# Patient Record
Sex: Female | Born: 1965 | Race: White | Hispanic: No | Marital: Married | Smoking: Current every day smoker
Health system: Southern US, Community
[De-identification: ages and names within clinical notes are randomized; demographics above are authoritative.]

## PROBLEM LIST (undated history)

## (undated) DIAGNOSIS — E119 Type 2 diabetes mellitus without complications: Secondary | ICD-10-CM

## (undated) DIAGNOSIS — Z8489 Family history of other specified conditions: Secondary | ICD-10-CM

## (undated) DIAGNOSIS — Z87442 Personal history of urinary calculi: Secondary | ICD-10-CM

## (undated) DIAGNOSIS — M4800 Spinal stenosis, site unspecified: Secondary | ICD-10-CM

## (undated) DIAGNOSIS — G629 Polyneuropathy, unspecified: Secondary | ICD-10-CM

## (undated) HISTORY — DX: Spinal stenosis, site unspecified: M48.00

## (undated) HISTORY — PX: KIDNEY STONE SURGERY: SHX686

## (undated) HISTORY — PX: EYE SURGERY: SHX253

## (undated) HISTORY — PX: TUBAL LIGATION: SHX77

## (undated) HISTORY — DX: Polyneuropathy, unspecified: G62.9

---

## 2009-08-03 ENCOUNTER — Emergency Department: Payer: Self-pay | Admitting: Emergency Medicine

## 2009-10-30 ENCOUNTER — Emergency Department: Payer: Self-pay | Admitting: Emergency Medicine

## 2009-11-02 ENCOUNTER — Emergency Department: Payer: Self-pay | Admitting: Emergency Medicine

## 2010-01-08 ENCOUNTER — Emergency Department: Payer: Self-pay | Admitting: Emergency Medicine

## 2010-04-19 ENCOUNTER — Emergency Department: Payer: Self-pay | Admitting: Emergency Medicine

## 2012-02-09 ENCOUNTER — Emergency Department: Payer: Self-pay | Admitting: Emergency Medicine

## 2012-04-08 ENCOUNTER — Emergency Department: Payer: Self-pay | Admitting: Emergency Medicine

## 2012-04-12 ENCOUNTER — Emergency Department: Payer: Self-pay | Admitting: Emergency Medicine

## 2012-10-22 ENCOUNTER — Emergency Department: Payer: Self-pay | Admitting: Emergency Medicine

## 2012-11-30 ENCOUNTER — Emergency Department: Payer: Self-pay | Admitting: Emergency Medicine

## 2013-08-07 ENCOUNTER — Emergency Department: Payer: Self-pay | Admitting: Emergency Medicine

## 2014-04-07 ENCOUNTER — Emergency Department: Payer: Self-pay | Admitting: Emergency Medicine

## 2014-06-16 ENCOUNTER — Emergency Department
Admission: EM | Admit: 2014-06-16 | Discharge: 2014-06-17 | Discharge status: Against Medical Advice | Payer: Self-pay | Attending: Emergency Medicine | Admitting: Emergency Medicine

## 2014-06-16 DIAGNOSIS — Y990 Civilian activity done for income or pay: Secondary | ICD-10-CM | POA: Insufficient documentation

## 2014-06-16 DIAGNOSIS — W1839XA Other fall on same level, initial encounter: Secondary | ICD-10-CM | POA: Insufficient documentation

## 2014-06-16 DIAGNOSIS — S8992XA Unspecified injury of left lower leg, initial encounter: Secondary | ICD-10-CM | POA: Insufficient documentation

## 2014-06-16 DIAGNOSIS — Y9389 Activity, other specified: Secondary | ICD-10-CM | POA: Insufficient documentation

## 2014-06-16 DIAGNOSIS — Y9289 Other specified places as the place of occurrence of the external cause: Secondary | ICD-10-CM | POA: Insufficient documentation

## 2014-06-16 NOTE — ED Notes (Signed)
Pt fell at work over a week ago, since then has had left shoulder pain that has been persistent.  Pain worse she she moves it, now radiates into left wrist.

## 2014-08-04 ENCOUNTER — Emergency Department: Payer: Self-pay

## 2014-08-04 ENCOUNTER — Emergency Department
Admission: EM | Admit: 2014-08-04 | Discharge: 2014-08-04 | Disposition: A | Discharge status: Home / Self Care | Payer: Self-pay | Attending: Emergency Medicine | Admitting: Emergency Medicine

## 2014-08-04 ENCOUNTER — Encounter: Payer: Self-pay | Admitting: Emergency Medicine

## 2014-08-04 DIAGNOSIS — M25512 Pain in left shoulder: Secondary | ICD-10-CM | POA: Insufficient documentation

## 2014-08-04 DIAGNOSIS — Z87828 Personal history of other (healed) physical injury and trauma: Secondary | ICD-10-CM | POA: Insufficient documentation

## 2014-08-04 DIAGNOSIS — Z720 Tobacco use: Secondary | ICD-10-CM | POA: Insufficient documentation

## 2014-08-04 DIAGNOSIS — E119 Type 2 diabetes mellitus without complications: Secondary | ICD-10-CM | POA: Insufficient documentation

## 2014-08-04 HISTORY — DX: Type 2 diabetes mellitus without complications: E11.9

## 2014-08-04 MED ORDER — TRAMADOL HCL 50 MG PO TABS: 50.0000 mg | ORAL_TABLET | Freq: Four times a day (QID) | ORAL | Status: DC | PRN

## 2014-08-04 MED ORDER — ALBUTEROL SULFATE (2.5 MG/3ML) 0.083% IN NEBU
INHALATION_SOLUTION | RESPIRATORY_TRACT | Status: AC
Start: 2014-08-04 — End: 2014-08-05
  Filled 2014-08-04: qty 3

## 2014-08-04 MED ORDER — NAPROXEN 500 MG PO TABS: 500.0000 mg | ORAL_TABLET | Freq: Two times a day (BID) | ORAL | Status: DC

## 2014-08-04 NOTE — ED Provider Notes (Signed)
[facility] Emergency Department Provider Note  ____________________________________________  Time seen: Approximately 11:44 AM  I have reviewed the triage vital signs and the nursing notes.   HISTORY  Chief Complaint Shoulder Pain    HPI Paula Jennings is a 49 y.o. female patient complaining of left shoulder pain for 2 weeks. Gives a history of a fall on the same shoulder about a month ago. Patient states pain increase with abduction of the upper extremity and also landing upon the shoulder. Patient denies any loss of sensation or loss of strength. Patient rates the pain as a 5/10 describe the sharp. Patient is right-hand dominant.   Past Medical History  Diagnosis Date  . Diabetes mellitus without complication     There are no active problems to display for this patient.   History reviewed. No pertinent past surgical history.  No current outpatient prescriptions on file.  Allergies Review of patient's allergies indicates no known allergies.  History reviewed. No pertinent family history.  Social History History  Substance Use Topics  . Smoking status: Current Every Day Smoker  . Smokeless tobacco: Not on file  . Alcohol Use: No    Review of Systems Constitutional: No fever/chills Eyes: No visual changes. ENT: No sore throat. Cardiovascular: Denies chest pain. Respiratory: Denies shortness of breath. Gastrointestinal: No abdominal pain.  No nausea, no vomiting.  No diarrhea.  No constipation. Genitourinary: Negative for dysuria. Musculoskeletal: Left shoulder pain. Skin: Negative for rash. Neurological: Negative for headaches, focal weakness or numbness. Endocrine:Diabetes 10-point ROS otherwise negative.  ____________________________________________   PHYSICAL EXAM:  VITAL SIGNS: ED Triage Vitals  Enc Vitals Group     BP --      Pulse --      Resp --      Temp --      Temp src --      SpO2 --      Weight --      Height  --      Head Cir --      Peak Flow --      Pain Score 08/04/14 1039 5     Pain Loc --      Pain Edu? --      Excl. in GC? --     Constitutional: Alert and oriented. Well appearing and in no acute distress. Eyes: Conjunctivae are normal. PERRL. EOMI. Head: Atraumatic. Nose: No congestion/rhinnorhea. Mouth/Throat: Mucous membranes are moist.  Oropharynx non-erythematous. Neck: No stridor.  No cervical spine tenderness to palpation. Hematological/Lymphatic/Immunilogical: No cervical lymphadenopathy. Cardiovascular: Normal rate, regular rhythm. Grossly normal heart sounds.  Good peripheral circulation. Respiratory: Normal respiratory effort.  No retractions. Lungs CTAB. Gastrointestinal: Soft and nontender. No distention. No abdominal bruits. No CVA tenderness. Musculoskeletal: Obvious deformity edema or abrasions noted to the left shoulder. Upper extremities neurovascularly intact decreased range of motion of abduction and overhead reaching. Patient had moderate guarding palpation GH joint.  Neurologic:  Normal speech and language. No gross focal neurologic deficits are appreciated. Speech is normal. No gait instability. Skin:  Skin is warm, dry and intact. No rash noted. Psychiatric: Mood and affect are normal. Speech and behavior are normal.  ____________________________________________   LABS (all labs ordered are listed, but only abnormal results are displayed)  Labs Reviewed - No data to display ____________________________________________  EKG   ____________________________________________  RADIOLOGY  No acute findings on x-ray. I, Joni Reining, personally viewed and evaluated these images as part of my medical decision making.  ____________________________________________   PROCEDURES  Procedure(s) performed: None  Critical Care performed: No  ____________________________________________   INITIAL IMPRESSION / ASSESSMENT AND PLAN / ED COURSE  Pertinent  labs & imaging results that were available during my care of the patient were reviewed by me and considered in my medical decision making (see chart for details).  Shoulder pain. Discuss x-ray results with patient. Advised she'll course anti-inflammatory medications decreased use of the upper extremity in the follow-up family doctor. Patient given a prescription for naproxen and tramadol. ____________________________________________   FINAL CLINICAL IMPRESSION(S) / ED DIAGNOSES  Final diagnoses:  Left anterior shoulder pain    Left shoulder pain.  Joni Reiningonald K Smith, PA-C 08/04/14 1238  Loleta Roseory Forbach, MD 08/08/14 [phone]

## 2014-08-04 NOTE — ED Notes (Signed)
Pt to ed with c/o left shoulder pain x 2 weeks.  Pt states she fell about 1 month ago and then about 2 weeks later, pain began in left shoulder.  Pt reports pain is worse when moving arm or when laying on left shoulder.

## 2014-08-04 NOTE — Discharge Instructions (Signed)

## 2014-10-20 ENCOUNTER — Emergency Department (HOSPITAL_COMMUNITY): Admission: EM | Admit: 2014-10-20 | Discharge: 2014-10-20 | Discharge status: Against Medical Advice | Payer: Self-pay

## 2014-10-20 NOTE — ED Notes (Signed)
Patient called 3 times.  No answer.

## 2016-02-29 ENCOUNTER — Emergency Department
Admission: EM | Admit: 2016-02-29 | Discharge: 2016-02-29 | Disposition: A | Discharge status: Home / Self Care | Payer: Self-pay | Attending: Emergency Medicine | Admitting: Emergency Medicine

## 2016-02-29 ENCOUNTER — Emergency Department: Payer: Self-pay

## 2016-02-29 DIAGNOSIS — Y999 Unspecified external cause status: Secondary | ICD-10-CM | POA: Insufficient documentation

## 2016-02-29 DIAGNOSIS — W1849XA Other slipping, tripping and stumbling without falling, initial encounter: Secondary | ICD-10-CM | POA: Insufficient documentation

## 2016-02-29 DIAGNOSIS — S93402A Sprain of unspecified ligament of left ankle, initial encounter: Secondary | ICD-10-CM | POA: Insufficient documentation

## 2016-02-29 DIAGNOSIS — Y939 Activity, unspecified: Secondary | ICD-10-CM | POA: Insufficient documentation

## 2016-02-29 DIAGNOSIS — Y929 Unspecified place or not applicable: Secondary | ICD-10-CM | POA: Insufficient documentation

## 2016-02-29 DIAGNOSIS — F17200 Nicotine dependence, unspecified, uncomplicated: Secondary | ICD-10-CM | POA: Insufficient documentation

## 2016-02-29 DIAGNOSIS — E119 Type 2 diabetes mellitus without complications: Secondary | ICD-10-CM | POA: Insufficient documentation

## 2016-02-29 MED ORDER — IBUPROFEN 600 MG PO TABS: 600.0000 mg | ORAL_TABLET | Freq: Three times a day (TID) | ORAL | 0 refills | Status: DC | PRN

## 2016-02-29 NOTE — ED Notes (Signed)
Ankle splint applied by Mellody DanceKeith ED Medic

## 2016-02-29 NOTE — ED Triage Notes (Signed)
Pt slipped on wet grass last night and landed on left foot/ankle - c/o left ankle pain - pt reports difficulty walking and pain when bearing weight

## 2016-02-29 NOTE — ED Provider Notes (Signed)
[facility] Emergency Department Provider Note  ____________________________________________   First MD Initiated Contact with Patient 02/29/16 1229     (approximate)  I have reviewed the triage vital signs and the nursing notes.   HISTORY  Chief Complaint Ankle Pain   HPI Paula Jennings is a 51 y.o. female is here with complaint of left foot and ankle pain. Patient states that she slipped last evening on wet grass. She denies any head injury or loss of consciousness. She is continued to ambulate since her accident. She states that walking has been difficult and pain is increased with weightbearing. She rates her pain as a 7 out of 10.   Past Medical History:  Diagnosis Date  . Diabetes mellitus without complication (HCC)     There are no active problems to display for this patient.   History reviewed. No pertinent surgical history.  Prior to Admission medications   Medication Sig Start Date End Date Taking? Authorizing Provider  ibuprofen (ADVIL,MOTRIN) 600 MG tablet Take 1 tablet (600 mg total) by mouth every 8 (eight) hours as needed. 02/29/16   Tommi Rumpshonda L Summers, PA-C    Allergies Patient has no known allergies.  No family history on file.  Social History Social History  Substance Use Topics  . Smoking status: Current Every Day Smoker    Packs/day: 0.50  . Smokeless tobacco: Never Used  . Alcohol use No    Review of Systems Constitutional: No fever/chills ENT: No injury Cardiovascular: Denies chest pain. Respiratory: Denies shortness of breath. Gastrointestinal: No abdominal pain.  No nausea, no vomiting.   Genitourinary: Negative for dysuria. Musculoskeletal: Positive for left ankle pain. Skin: Negative for rash. Neurological: Negative for headaches, focal weakness or numbness.  10-point ROS otherwise negative.  ____________________________________________   PHYSICAL EXAM:  VITAL SIGNS: ED Triage Vitals  Enc Vitals  Group     BP 02/29/16 1219 (!) 142/79     Pulse Rate 02/29/16 1219 100     Resp 02/29/16 1219 18     Temp 02/29/16 1219 98.4 F (36.9 C)     Temp Source 02/29/16 1219 Oral     SpO2 02/29/16 1219 98 %     Weight 02/29/16 1219 160 lb (72.6 kg)     Height 02/29/16 1219 5\' 5"  (1.651 m)     Head Circumference --      Peak Flow --      Pain Score 02/29/16 1223 7     Pain Loc --      Pain Edu? --      Excl. in GC? --     Constitutional: Alert and oriented. Well appearing and in no acute distress. Eyes: Conjunctivae are normal. PERRL. EOMI. Head: Atraumatic. Nose: No congestion/rhinnorhea. Neck: No stridor.  Cardiovascular: Normal rate, regular rhythm. Grossly normal heart sounds.  Good peripheral circulation. Respiratory: Normal respiratory effort.  No retractions. Lungs CTAB. Gastrointestinal: Soft and nontender. No distention.  Musculoskeletal:Examination of left ankle there is no gross deformity. There is minimal soft tissue swelling. There is some minimal tenderness on palpation of the lateral aspect. Range of motion is minimally limited secondary to discomfort but patient is able to flex and extend. Neurologic:  Normal speech and language. No gross focal neurologic deficits are appreciated. No gait instability. Skin:  Skin is warm, dry and intact. No ecchymosis or abrasions were seen. Psychiatric: Mood and affect are normal. Speech and behavior are normal.  ____________________________________________   LABS (all labs ordered are listed, but  only abnormal results are displayed)  Labs Reviewed - No data to display  RADIOLOGY Left ankle x-ray per radiologist is negative for fracture or dislocation. I, Tommi Rumps, personally viewed and evaluated these images (plain radiographs) as part of my medical decision making, as well as reviewing the written report by the radiologist.  ____________________________________________   PROCEDURES  Procedure(s) performed:  None  Procedures  Critical Care performed: No  ____________________________________________   INITIAL IMPRESSION / ASSESSMENT AND PLAN / ED COURSE  Pertinent labs & imaging results that were available during my care of the patient were reviewed by me and considered in my medical decision making (see chart for details).  Patient was made aware that her ankle x-ray was negative. Patient was placed in a Velcro ankle splint with instructions to ice and elevate today as needed for pain or swelling. She will follow-up with Dr. Rosita Kea if any continued problems or her primary care is she needs any other medication for pain. Patient was given a note for today for work excuse.   ____________________________________________   FINAL CLINICAL IMPRESSION(S) / ED DIAGNOSES  Final diagnoses:  Sprain of left ankle, unspecified ligament, initial encounter      NEW MEDICATIONS STARTED DURING THIS VISIT:  Discharge Medication List as of 02/29/2016  1:37 PM    START taking these medications   Details  ibuprofen (ADVIL,MOTRIN) 600 MG tablet Take 1 tablet (600 mg total) by mouth every 8 (eight) hours as needed., Starting Tue 02/29/2016, Print         Note:  This document was prepared using Dragon voice recognition software and may include unintentional dictation errors.    Tommi Rumps, PA-C 02/29/16 Rickey Primus    Jene Every, MD 03/06/16 [phone]

## 2016-02-29 NOTE — Discharge Instructions (Signed)
Ice and elevate as needed. Wear her ankle splint as needed for support. Take ibuprofen with food as directed. Follow-up with your primary care doctor if any continued problems.

## 2016-02-29 NOTE — ED Notes (Signed)
Pt slipped on wet grass last night and landed on left foot/ankle - c/o left ankle pain - pt reports difficulty walking and pain when bearing weight 

## 2016-09-30 ENCOUNTER — Emergency Department
Admission: EM | Admit: 2016-09-30 | Discharge: 2016-09-30 | Disposition: A | Discharge status: Home / Self Care | Payer: Self-pay | Attending: Emergency Medicine | Admitting: Emergency Medicine

## 2016-09-30 ENCOUNTER — Emergency Department: Payer: Self-pay

## 2016-09-30 ENCOUNTER — Encounter: Payer: Self-pay | Admitting: Emergency Medicine

## 2016-09-30 DIAGNOSIS — M25571 Pain in right ankle and joints of right foot: Secondary | ICD-10-CM | POA: Insufficient documentation

## 2016-09-30 DIAGNOSIS — F17210 Nicotine dependence, cigarettes, uncomplicated: Secondary | ICD-10-CM | POA: Insufficient documentation

## 2016-09-30 DIAGNOSIS — E119 Type 2 diabetes mellitus without complications: Secondary | ICD-10-CM | POA: Insufficient documentation

## 2016-09-30 MED ORDER — MELOXICAM 7.5 MG PO TABS: 7.5000 mg | ORAL_TABLET | Freq: Every day | ORAL | 1 refills | Status: AC

## 2016-09-30 NOTE — ED Provider Notes (Signed)
[facility] Emergency Department Provider Note  ____________________________________________  Time seen: Approximately 9:51 PM  I have reviewed the triage vital signs and the nursing notes.   HISTORY  Chief Complaint Ankle Pain    HPI Paula Jennings is a 51 y.o. female presents to the emergency department with right ankle stiffness and self perceived swelling. Patient states that she has experienced symptoms for the past "several days". Patient states that she is on her feet most of the time. She denies recent travel, prolonged immobilization, recent surgery, history of DVT or PE or knowledge of current malignancy. She denies weakness, radiculopathy or changes in sensation in the lower extremities.   Past Medical History:  Diagnosis Date  . Diabetes mellitus without complication (HCC)     There are no active problems to display for this patient.   Past Surgical History:  Procedure Laterality Date  . EYE SURGERY    . KIDNEY STONE SURGERY    . TUBAL LIGATION      Prior to Admission medications   Medication Sig Start Date End Date Taking? Authorizing Provider  ibuprofen (ADVIL,MOTRIN) 600 MG tablet Take 1 tablet (600 mg total) by mouth every 8 (eight) hours as needed. 02/29/16   Tommi Rumps, PA-C  meloxicam (MOBIC) 7.5 MG tablet Take 1 tablet (7.5 mg total) by mouth daily. 09/30/16 10/07/16  Orvil Feil, PA-C    Allergies Patient has no known allergies.  No family history on file.  Social History Social History  Substance Use Topics  . Smoking status: Current Every Day Smoker    Packs/day: 0.50    Types: Cigarettes  . Smokeless tobacco: Never Used  . Alcohol use No     Review of Systems  Constitutional: No fever/chills Eyes: No visual changes. No discharge ENT: No upper respiratory complaints. Cardiovascular: no chest pain. Respiratory: no cough. No SOB. Musculoskeletal: Patient has right ankle stiffness.  Skin: Negative for rash,  abrasions, lacerations, ecchymosis. Neurological: Negative for headaches, focal weakness or numbness.   ____________________________________________   PHYSICAL EXAM:  VITAL SIGNS: ED Triage Vitals  Enc Vitals Group     BP      Pulse      Resp      Temp      Temp src      SpO2      Weight      Height      Head Circumference      Peak Flow      Pain Score      Pain Loc      Pain Edu?      Excl. in GC?      Constitutional: Alert and oriented. Well appearing and in no acute distress. Eyes: Conjunctivae are normal. PERRL. EOMI. Head: Atraumatic. Cardiovascular: Normal rate, regular rhythm. Normal S1 and S2.  Good peripheral circulation. Respiratory: Normal respiratory effort without tachypnea or retractions. Lungs CTAB. Good air entry to the bases with no decreased or absent breath sounds. Musculoskeletal: Patient is able to perform full range of motion at the right ankle and right knee. She is able to move all 5 right toes. Palpable dorsalis pedis pulse, right. Neurologic:  Normal speech and language. No gross focal neurologic deficits are appreciated.  Skin:  No erythema or edema overlying the right ankle. Psychiatric: Mood and affect are normal. Speech and behavior are normal. Patient exhibits appropriate insight and judgement.   ____________________________________________   LABS (all labs ordered are listed, but only abnormal results  are displayed)  Labs Reviewed - No data to display ____________________________________________  EKG   ____________________________________________  RADIOLOGY Geraldo PitterI, Jaclyn M Woods, personally viewed and evaluated these images (plain radiographs) as part of my medical decision making, as well as reviewing the written report by the radiologist.    Dg Ankle Complete Right  Result Date: 09/30/2016 CLINICAL DATA:  Nontraumatic right ankle pain and swelling for 1 week EXAM: RIGHT ANKLE - COMPLETE 3+ VIEW COMPARISON:  None. FINDINGS: Cold  corticated fragment at the medial malleolus. No evidence of acute fracture. No bone lesion or bony destruction. Mortise is symmetric. Incidental plantar calcaneal spur. IMPRESSION: No acute findings.  No significant arthritic changes at the ankle. Electronically Signed   By: Ellery Plunkaniel R Mitchell M.D.   On: 09/30/2016 22:46    ____________________________________________    PROCEDURES  Procedure(s) performed:    Procedures    Medications - No data to display   ____________________________________________   INITIAL IMPRESSION / ASSESSMENT AND PLAN / ED COURSE  Pertinent labs & imaging results that were available during my care of the patient were reviewed by me and considered in my medical decision making (see chart for details).  Review of the [place] CSRS was performed in accordance of the [facility] prior to dispensing any controlled drugs.     Assessment and Plan:  Right ankle pain  Patient presents to the emergency department with right ankle pain for the past several days. X-ray examination conducted in the emergency department is unremarkable for acute fractures. Patient was discharged with Mobic. She was advised to follow-up with podiatry as needed. Vital signs are reassuring prior to discharge.    ____________________________________________  FINAL CLINICAL IMPRESSION(S) / ED DIAGNOSES  Final diagnoses:  Acute right ankle pain      NEW MEDICATIONS STARTED DURING THIS VISIT:  New Prescriptions   MELOXICAM (MOBIC) 7.5 MG TABLET    Take 1 tablet (7.5 mg total) by mouth daily.        This chart was dictated using voice recognition software/Dragon. Despite best efforts to proofread, errors can occur which can change the meaning. Any change was purely unintentional.    Orvil FeilWoods, Jaclyn M, PA-C 09/30/16 2314    Sharman CheekStafford, Phillip, MD 09/30/16 2326

## 2016-09-30 NOTE — ED Triage Notes (Signed)
Pt presents to ED with persistent swelling to her right ankle with stiffness to the affected joint noted for the past week. No known injury. Swelling noted. +pulses and movement present upon triage.

## 2016-12-04 ENCOUNTER — Encounter: Payer: Self-pay | Admitting: Emergency Medicine

## 2016-12-04 ENCOUNTER — Other Ambulatory Visit: Payer: Self-pay

## 2016-12-04 ENCOUNTER — Emergency Department: Payer: Self-pay

## 2016-12-04 ENCOUNTER — Emergency Department
Admission: EM | Admit: 2016-12-04 | Discharge: 2016-12-04 | Disposition: A | Discharge status: Home / Self Care | Payer: Self-pay | Attending: Emergency Medicine | Admitting: Emergency Medicine

## 2016-12-04 DIAGNOSIS — J40 Bronchitis, not specified as acute or chronic: Secondary | ICD-10-CM

## 2016-12-04 DIAGNOSIS — E119 Type 2 diabetes mellitus without complications: Secondary | ICD-10-CM | POA: Insufficient documentation

## 2016-12-04 DIAGNOSIS — F17210 Nicotine dependence, cigarettes, uncomplicated: Secondary | ICD-10-CM | POA: Insufficient documentation

## 2016-12-04 MED ORDER — ALBUTEROL SULFATE HFA 108 (90 BASE) MCG/ACT IN AERS: 2.0000 | INHALATION_SPRAY | RESPIRATORY_TRACT | 0 refills | Status: DC | PRN

## 2016-12-04 MED ORDER — PREDNISONE 50 MG PO TABS: 50.0000 mg | ORAL_TABLET | Freq: Every day | ORAL | 0 refills | Status: DC

## 2016-12-04 MED ORDER — PSEUDOEPH-BROMPHEN-DM 30-2-10 MG/5ML PO SYRP: 10.0000 mL | ORAL_SOLUTION | Freq: Four times a day (QID) | ORAL | 0 refills | Status: DC | PRN

## 2016-12-04 NOTE — ED Notes (Signed)
Patient taken to imaging. 

## 2016-12-04 NOTE — ED Triage Notes (Signed)
Patient to ER for c/o "I think I have bronchitis.". Patient reports dry cough that began this afternoon. States "Now I'm starting to have a little chest pain after coughing.". Patient in no acute distress. Denies nasal drainage.

## 2016-12-04 NOTE — ED Provider Notes (Signed)
[facility] Emergency Department Provider Note  ____________________________________________  Time seen: Approximately 10:40 PM  I have reviewed the triage vital signs and the nursing notes.   HISTORY  Chief Complaint Cough    HPI Paula Jennings is a 51 y.o. female who presents the emergency department with a dry cough x1 bronchitis and states that symptoms are similar to previous.  She is a smoker but started approximately 15 years ago with a half a pack a day for 7-1/2-year pack history.  Patient denies any fevers or chills, nasal congestion, sore throat, ear pain.  Cough is nonproductive.  No shortness of breath.  Patient does have some mild chest tightness after coughing episodes.  She denies ongoing chest pain other than coughing episodes.  No other complaints of nausea, vomiting, diarrhea or constipation.  No medications for this complaint prior to arrival  Past Medical History:  Diagnosis Date  . Diabetes mellitus without complication (HCC)     There are no active problems to display for this patient.   Past Surgical History:  Procedure Laterality Date  . EYE SURGERY    . KIDNEY STONE SURGERY    . TUBAL LIGATION      Prior to Admission medications   Medication Sig Start Date End Date Taking? Authorizing Provider  albuterol (PROVENTIL HFA;VENTOLIN HFA) 108 (90 Base) MCG/ACT inhaler Inhale 2 puffs every 4 (four) hours as needed into the lungs for wheezing or shortness of breath. 12/04/16   Delando Satter, Delorise RoyalsJonathan D, PA-C  brompheniramine-pseudoephedrine-DM 30-2-10 MG/5ML syrup Take 10 mLs 4 (four) times daily as needed by mouth. 12/04/16   Delyla Sandeen, Delorise RoyalsJonathan D, PA-C  ibuprofen (ADVIL,MOTRIN) 600 MG tablet Take 1 tablet (600 mg total) by mouth every 8 (eight) hours as needed. 02/29/16   Tommi RumpsSummers, Rhonda L, PA-C  predniSONE (DELTASONE) 50 MG tablet Take 1 tablet (50 mg total) daily with breakfast by mouth. 12/04/16   Leylanie Woodmansee, Delorise RoyalsJonathan D, PA-C     Allergies Patient has no known allergies.  No family history on file.  Social History Social History   Tobacco Use  . Smoking status: Current Every Day Smoker    Packs/day: 0.50    Types: Cigarettes  . Smokeless tobacco: Never Used  Substance Use Topics  . Alcohol use: No  . Drug use: No     Review of Systems  Constitutional: No fever/chills Eyes: No visual changes. No discharge ENT: No upper respiratory complaints. Cardiovascular: no chest pain. Respiratory: Positive cough. No SOB. Gastrointestinal: No abdominal pain.  No nausea, no vomiting.  No diarrhea.  No constipation. Musculoskeletal: Negative for musculoskeletal pain. Skin: Negative for rash, abrasions, lacerations, ecchymosis. Neurological: Negative for headaches, focal weakness or numbness. 10-point ROS otherwise negative.  ____________________________________________   PHYSICAL EXAM:  VITAL SIGNS: ED Triage Vitals  Enc Vitals Group     BP 12/04/16 2136 126/77     Pulse Rate 12/04/16 2136 (!) 104     Resp 12/04/16 2136 18     Temp 12/04/16 2136 98.1 F (36.7 C)     Temp Source 12/04/16 2136 Oral     SpO2 12/04/16 2136 98 %     Weight 12/04/16 2137 160 lb (72.6 kg)     Height 12/04/16 2137 5\' 5"  (1.651 m)     Head Circumference --      Peak Flow --      Pain Score 12/04/16 2136 5     Pain Loc --      Pain Edu? --  Excl. in GC? --      Constitutional: Alert and oriented. Well appearing and in no acute distress. Eyes: Conjunctivae are normal. PERRL. EOMI. Head: Atraumatic. ENT:      Ears: EACs and TMs unremarkable bilaterally      Nose: No congestion/rhinnorhea.      Mouth/Throat: Mucous membranes are moist.  Neck: No stridor.  Supple full range of motion Hematological/Lymphatic/Immunilogical: No cervical lymphadenopathy. Cardiovascular: Normal rate, regular rhythm. Normal S1 and S2.  Good peripheral circulation. Respiratory: Normal respiratory effort without tachypnea or  retractions. Lungs with a few scattered expiratory wheezes bilaterally.  No rales or rhonchi.Peri Jefferson air entry to the bases with no decreased or absent breath sounds. Musculoskeletal: Full range of motion to all extremities. No gross deformities appreciated. Neurologic:  Normal speech and language. No gross focal neurologic deficits are appreciated.  Skin:  Skin is warm, dry and intact. No rash noted. Psychiatric: Mood and affect are normal. Speech and behavior are normal. Patient exhibits appropriate insight and judgement.   ____________________________________________   LABS (all labs ordered are listed, but only abnormal results are displayed)  Labs Reviewed - No data to display ____________________________________________  EKG  ED ECG REPORT I, Delorise Royals Tadarrius Burch,  personally viewed and interpreted this ECG.   Date: 12/04/2016  EKG Time: 2139 hrs.  Rate: 96 bpm  Rhythm: normal EKG, normal sinus rhythm, there are no previous tracings available for comparison  Axis: Normal axis  Intervals:none  ST&T Change: No ST elevations or depressions noted.  Normal EKG  ____________________________________________  RADIOLOGY Festus Barren Dorsey Authement, personally viewed and evaluated these images (plain radiographs) as part of my medical decision making, as well as reviewing the written report by the radiologist.  Dg Chest 2 View  Result Date: 12/04/2016 CLINICAL DATA:  Cough beginning this afternoon. Chest pain after coughing. EXAM: CHEST  2 VIEW COMPARISON:  10/30/2009 FINDINGS: Mild hyperinflation. The heart size and mediastinal contours are within normal limits. Both lungs are clear. The visualized skeletal structures are unremarkable. IMPRESSION: No active cardiopulmonary disease. Electronically Signed   By: Burman Nieves M.D.   On: 12/04/2016 22:26    ____________________________________________    PROCEDURES  Procedure(s) performed:    Procedures    Medications - No  data to display   ____________________________________________   INITIAL IMPRESSION / ASSESSMENT AND PLAN / ED COURSE  Pertinent labs & imaging results that were available during my care of the patient were reviewed by me and considered in my medical decision making (see chart for details).  Review of the [place] CSRS was performed in accordance of the [facility] prior to dispensing any controlled drugs.     Patient's diagnosis is consistent with bronchitis.  Differential included viral URI, bronchitis, pneumonia.  Chest x-ray reveals no consolidation consistent with pneumonia.. Patient will be discharged home with prescriptions for prednisone, Bromfed cough syrup, albuterol. Patient is to follow up with primary care as needed or otherwise directed. Patient is given ED precautions to return to the ED for any worsening or new symptoms.     ____________________________________________  FINAL CLINICAL IMPRESSION(S) / ED DIAGNOSES  Final diagnoses:  Bronchitis      NEW MEDICATIONS STARTED DURING THIS VISIT:  Albuterol inhaler, 2 puffs every 4 hours as needed for shortness of breath Bromfed cough syrup, 10 mL's every 6 hours as needed for cough Prednisone 50 mg tablet, daily for 5 days      This chart was dictated using voice recognition software/Dragon. Despite best efforts  to proofread, errors can occur which can change the meaning. Any change was purely unintentional.    Racheal Patches, PA-C 12/04/16 2243    Seylah Wernert, Delorise Royals, PA-C 12/04/16 2247    Myrna Blazer, MD 12/04/16 [phone]

## 2017-11-13 ENCOUNTER — Encounter: Payer: Self-pay | Admitting: Emergency Medicine

## 2017-11-13 ENCOUNTER — Emergency Department: Payer: Self-pay

## 2017-11-13 ENCOUNTER — Other Ambulatory Visit: Payer: Self-pay

## 2017-11-13 ENCOUNTER — Emergency Department
Admission: EM | Admit: 2017-11-13 | Discharge: 2017-11-13 | Disposition: A | Discharge status: Home / Self Care | Payer: Self-pay | Attending: Emergency Medicine | Admitting: Emergency Medicine

## 2017-11-13 DIAGNOSIS — M7732 Calcaneal spur, left foot: Secondary | ICD-10-CM | POA: Insufficient documentation

## 2017-11-13 DIAGNOSIS — E119 Type 2 diabetes mellitus without complications: Secondary | ICD-10-CM | POA: Insufficient documentation

## 2017-11-13 DIAGNOSIS — M79672 Pain in left foot: Secondary | ICD-10-CM

## 2017-11-13 DIAGNOSIS — Z794 Long term (current) use of insulin: Secondary | ICD-10-CM | POA: Insufficient documentation

## 2017-11-13 DIAGNOSIS — F17210 Nicotine dependence, cigarettes, uncomplicated: Secondary | ICD-10-CM | POA: Insufficient documentation

## 2017-11-13 MED ORDER — TRAMADOL HCL 50 MG PO TABS: 50.0000 mg | ORAL_TABLET | Freq: Two times a day (BID) | ORAL | 0 refills | Status: DC | PRN

## 2017-11-13 MED ORDER — NAPROXEN 500 MG PO TABS: 500.0000 mg | ORAL_TABLET | Freq: Two times a day (BID) | ORAL | Status: DC

## 2017-11-13 NOTE — Discharge Instructions (Addendum)
Wear shoe of comfort, take medication as directed, follow-up with podiatry.

## 2017-11-13 NOTE — ED Notes (Signed)
See triage note  Presents with pain to both feet  States pain is mainly to top of feet and moves up to ankle  States pain is worse to left foot  Describes pain as stabbing

## 2017-11-13 NOTE — ED Triage Notes (Signed)
Pt presnets to ED via POV with c/o intermittent, stabbing L foot pain x years. Pt states no pain right now however "when it hits, it's 11/10." Pt is alert and oriented, noted to be ambulatory with limp.

## 2017-11-13 NOTE — ED Provider Notes (Signed)
[facility] Emergency Department Provider Note   ____________________________________________   First MD Initiated Contact with Patient 11/13/17 1520     (approximate)  I have reviewed the triage vital signs and the nursing notes.   HISTORY  Chief Complaint Foot Pain    HPI Paula Jennings is a 52 y.o. female patient presents with intermittent stabbing left foot pain for a number years.  Patient was told she had a spur on the dorsal aspect of foot 4 years ago.  Patient the pain is increasing that she is walking with atypical gait.  She said her work requires prolonged standing.  Patient state currently she is not having pain when she sat down and took off her shoes.  Past Medical History:  Diagnosis Date  . Diabetes mellitus without complication (HCC)     There are no active problems to display for this patient.   Past Surgical History:  Procedure Laterality Date  . EYE SURGERY    . KIDNEY STONE SURGERY    . TUBAL LIGATION      Prior to Admission medications   Medication Sig Start Date End Date Taking? Authorizing Provider  insulin detemir (LEVEMIR) 100 UNIT/ML injection Inject into the skin daily.   Yes [provider]  insulin regular (NOVOLIN R,HUMULIN R) 100 units/mL injection Inject into the skin 3 (three) times daily before meals.   Yes [provider]  lisinopril (PRINIVIL,ZESTRIL) 10 MG tablet Take 10 mg by mouth daily.   Yes [provider]  albuterol (PROVENTIL HFA;VENTOLIN HFA) 108 (90 Base) MCG/ACT inhaler Inhale 2 puffs every 4 (four) hours as needed into the lungs for wheezing or shortness of breath. 12/04/16   Cuthriell, Delorise Royals, PA-C  brompheniramine-pseudoephedrine-DM 30-2-10 MG/5ML syrup Take 10 mLs 4 (four) times daily as needed by mouth. 12/04/16   Cuthriell, Delorise Royals, PA-C  ibuprofen (ADVIL,MOTRIN) 600 MG tablet Take 1 tablet (600 mg total) by mouth every 8 (eight) hours as needed. 02/29/16   Tommi Rumps, PA-C  naproxen (NAPROSYN) 500 MG tablet Take 1 tablet (500 mg total) by mouth 2 (two) times daily with a meal. 11/13/17   Joni Reining, PA-C  predniSONE (DELTASONE) 50 MG tablet Take 1 tablet (50 mg total) daily with breakfast by mouth. 12/04/16   Cuthriell, Delorise Royals, PA-C  traMADol (ULTRAM) 50 MG tablet Take 1 tablet (50 mg total) by mouth every 12 (twelve) hours as needed. 11/13/17   Joni Reining, PA-C    Allergies Patient has no known allergies.  No family history on file.  Social History Social History   Tobacco Use  . Smoking status: Current Every Day Smoker    Packs/day: 0.50    Types: Cigarettes  . Smokeless tobacco: Never Used  Substance Use Topics  . Alcohol use: No  . Drug use: No    Review of Systems Constitutional: No fever/chills Eyes: No visual changes. ENT: No sore throat. Cardiovascular: Denies chest pain. Respiratory: Denies shortness of breath. Gastrointestinal: No abdominal pain.  No nausea, no vomiting.  No diarrhea.  No constipation. Genitourinary: Negative for dysuria. Musculoskeletal: Negative for back pain. Skin: Negative for rash. Neurological: Negative for headaches, focal weakness or numbness.   ____________________________________________   PHYSICAL EXAM:  VITAL SIGNS: ED Triage Vitals  Enc Vitals Group     BP 11/13/17 1519 (!) 145/67     Pulse Rate 11/13/17 1519 (!) 109     Resp 11/13/17 1519 18     Temp 11/13/17  1519 97.7 F (36.5 C)     Temp Source 11/13/17 1519 Oral     SpO2 11/13/17 1519 98 %     Weight 11/13/17 1520 153 lb (69.4 kg)     Height 11/13/17 1520 5\' 5"  (1.651 m)     Head Circumference --      Peak Flow --      Pain Score 11/13/17 1520 0     Pain Loc --      Pain Edu? --      Excl. in GC? --    Constitutional: Alert and oriented. Well appearing and in no acute distress. moist.  Oropharynx non-erythematous. Cardiovascular: Normal rate, regular rhythm. Grossly normal heart sounds.  Good  peripheral circulation. Respiratory: Normal respiratory effort.  No retractions. Lungs CTAB. Gastrointestinal: Soft and nontender. No distention. No abdominal bruits. No CVA tenderness. Musculoskeletal: No lower extremity tenderness nor edema.  No joint effusions. Neurologic:  Normal speech and language. No gross focal neurologic deficits are appreciated. No gait instability. Skin:  Skin is warm, dry and intact. No rash noted. Psychiatric: Mood and affect are normal. Speech and behavior are normal.  ____________________________________________   LABS (all labs ordered are listed, but only abnormal results are displayed)  Labs Reviewed - No data to display ____________________________________________  EKG   ____________________________________________  RADIOLOGY  ED MD interpretation:    Official radiology report(s): Dg Foot Complete Left  Result Date: 11/13/2017 CLINICAL DATA:  Intermittent stabbing left foot pain for years. EXAM: LEFT FOOT - COMPLETE 3+ VIEW COMPARISON:  One hundred fourteen FINDINGS: Joint space narrowing spurring about the first TMT articulation. Calcaneal enthesopathy is noted more prominently along the plantar aspect measuring up to 6 mm. No additional osseous abnormalities. No acute fracture or frank bone destruction. Soft tissues are intact without significant soft tissue swelling. 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 Electronically Signed   By: Tollie Eth M.D.   On: 11/13/2017 15:59    ____________________________________________   PROCEDURES  Procedure(s) performed: None  Procedures  Critical Care performed: No  ____________________________________________   INITIAL IMPRESSION / ASSESSMENT AND PLAN / ED COURSE  As part of my medical decision making, I reviewed the following data within the electronic MEDICAL RECORD NUMBER    Right foot pain secondary to heel spur degenerative changes.   Discussed x-ray findings with patient.  Patient given discharge care instruction advised follow-up with podiatry for definitive evaluation and treatment.  Take medication as directed and wear open shoe as needed.      ____________________________________________   FINAL CLINICAL IMPRESSION(S) / ED DIAGNOSES  Final diagnoses:  Foot pain, left  Heel spur, left     ED Discharge Orders         Ordered    naproxen (NAPROSYN) 500 MG tablet  2 times daily with meals     11/13/17 1613    traMADol (ULTRAM) 50 MG tablet  Every 12 hours PRN     11/13/17 1613           Note:  This document was prepared using Dragon voice recognition software and may include unintentional dictation errors.    Joni Reining, PA-C 11/13/17 1615    Nita Sickle, MD 11/14/17 1116

## 2017-12-30 ENCOUNTER — Emergency Department: Payer: Self-pay

## 2017-12-30 ENCOUNTER — Emergency Department
Admission: EM | Admit: 2017-12-30 | Discharge: 2017-12-30 | Disposition: A | Discharge status: Home / Self Care | Payer: Self-pay | Attending: Student in an Organized Health Care Education/Training Program | Admitting: Student in an Organized Health Care Education/Training Program

## 2017-12-30 ENCOUNTER — Encounter: Payer: Self-pay | Admitting: Emergency Medicine

## 2017-12-30 ENCOUNTER — Other Ambulatory Visit: Payer: Self-pay

## 2017-12-30 DIAGNOSIS — R51 Headache: Secondary | ICD-10-CM | POA: Insufficient documentation

## 2017-12-30 DIAGNOSIS — E860 Dehydration: Secondary | ICD-10-CM | POA: Insufficient documentation

## 2017-12-30 DIAGNOSIS — F17210 Nicotine dependence, cigarettes, uncomplicated: Secondary | ICD-10-CM | POA: Insufficient documentation

## 2017-12-30 DIAGNOSIS — E119 Type 2 diabetes mellitus without complications: Secondary | ICD-10-CM | POA: Insufficient documentation

## 2017-12-30 DIAGNOSIS — H538 Other visual disturbances: Secondary | ICD-10-CM | POA: Insufficient documentation

## 2017-12-30 DIAGNOSIS — Z79899 Other long term (current) drug therapy: Secondary | ICD-10-CM | POA: Insufficient documentation

## 2017-12-30 DIAGNOSIS — R519 Headache, unspecified: Secondary | ICD-10-CM

## 2017-12-30 DIAGNOSIS — Z794 Long term (current) use of insulin: Secondary | ICD-10-CM | POA: Insufficient documentation

## 2017-12-30 LAB — CBC WITH DIFFERENTIAL/PLATELET
ABS IMMATURE GRANULOCYTES: 0.05 10*3/uL (ref 0.00–0.07)
Basophils Absolute: 0.1 10*3/uL (ref 0.0–0.1)
Basophils Relative: 1 %
Eosinophils Absolute: 0.4 10*3/uL (ref 0.0–0.5)
Eosinophils Relative: 4 %
HCT: 38.6 % (ref 36.0–46.0)
Hemoglobin: 12.4 g/dL (ref 12.0–15.0)
IMMATURE GRANULOCYTES: 0 %
Lymphocytes Relative: 30 %
Lymphs Abs: 3.3 10*3/uL (ref 0.7–4.0)
MCH: 29.2 pg (ref 26.0–34.0)
MCHC: 32.1 g/dL (ref 30.0–36.0)
MCV: 90.8 fL (ref 80.0–100.0)
MONOS PCT: 7 %
Monocytes Absolute: 0.8 10*3/uL (ref 0.1–1.0)
Neutro Abs: 6.5 10*3/uL (ref 1.7–7.7)
Neutrophils Relative %: 58 %
PLATELETS: 367 10*3/uL (ref 150–400)
RBC: 4.25 MIL/uL (ref 3.87–5.11)
RDW: 12.9 % (ref 11.5–15.5)
WBC: 11.2 10*3/uL — ABNORMAL HIGH (ref 4.0–10.5)
nRBC: 0 % (ref 0.0–0.2)

## 2017-12-30 LAB — COMPREHENSIVE METABOLIC PANEL
ALT: 14 U/L (ref 0–44)
AST: 13 U/L — ABNORMAL LOW (ref 15–41)
Albumin: 4.1 g/dL (ref 3.5–5.0)
Alkaline Phosphatase: 61 U/L (ref 38–126)
Anion gap: 7 (ref 5–15)
BUN: 21 mg/dL — AB (ref 6–20)
CHLORIDE: 105 mmol/L (ref 98–111)
CO2: 24 mmol/L (ref 22–32)
CREATININE: 0.91 mg/dL (ref 0.44–1.00)
Calcium: 8.7 mg/dL — ABNORMAL LOW (ref 8.9–10.3)
GFR calc Af Amer: >60 mL/min (ref >60)
GFR calc non Af Amer: >60 mL/min (ref >60)
GLUCOSE: 162 mg/dL — AB (ref 70–99)
Potassium: 4.1 mmol/L (ref 3.5–5.1)
Sodium: 136 mmol/L (ref 135–145)
Total Bilirubin: 0.7 mg/dL (ref 0.3–1.2)
Total Protein: 7.4 g/dL (ref 6.5–8.1)

## 2017-12-30 LAB — GLUCOSE, CAPILLARY: Glucose-Capillary: 160 mg/dL — ABNORMAL HIGH (ref 70–99)

## 2017-12-30 MED ORDER — SODIUM CHLORIDE 0.9 % IV BOLUS
1000.0000 mL | Freq: Once | INTRAVENOUS | Status: AC
  Administered 2017-12-30: 1000 mL via INTRAVENOUS

## 2017-12-30 MED ORDER — ACETAMINOPHEN 500 MG PO TABS
1000.0000 mg | ORAL_TABLET | Freq: Once | ORAL | Status: AC
  Administered 2017-12-30: 1000 mg via ORAL
  Filled 2017-12-30: qty 2

## 2017-12-30 MED ORDER — DIPHENHYDRAMINE HCL 50 MG/ML IJ SOLN
12.5000 mg | Freq: Once | INTRAMUSCULAR | Status: AC
  Administered 2017-12-30: 12.5 mg via INTRAVENOUS
  Filled 2017-12-30: qty 1

## 2017-12-30 MED ORDER — PROCHLORPERAZINE EDISYLATE 10 MG/2ML IJ SOLN
10.0000 mg | Freq: Once | INTRAMUSCULAR | Status: AC
  Administered 2017-12-30: 10 mg via INTRAVENOUS
  Filled 2017-12-30: qty 2

## 2017-12-30 NOTE — ED Notes (Signed)
Spoke with MD Goodman, no new orders at this time  

## 2017-12-30 NOTE — Discharge Instructions (Addendum)

## 2017-12-30 NOTE — ED Triage Notes (Signed)
PT arrives with a complaints of left and right temporal headache and visual disturbances that started at 1000 this morning. Pt states she sees grey spots since 1000 today. Pt denies sensitivity to light/sounds. No neuro deficits in triage.

## 2017-12-30 NOTE — ED Provider Notes (Signed)
[facility] Emergency Department Provider Note    First MD Initiated Contact with Patient 12/30/17 1748     (approximate)  I have reviewed the triage vital signs and the nursing notes.   HISTORY  Chief Complaint Headache    HPI Paula Jennings is a 52 y.o. female the history of diabetes presents the ER for sudden onset headache that started around 10:30 AM while she was at work.  States she was having blurry vision gray spots in her vision.  Felt lightheaded and nauseated.  Loud noises and bright lights were bothering her.  Denies any headache like this before.  Has had some sinus congestion but is never had anything like this.  Does feel dehydrated.  Denies any measured fevers.  No neck stiffness or pain.  No syncope or loss of consciousness.    Past Medical History:  Diagnosis Date  . Diabetes mellitus without complication (HCC)    No family history on file. Past Surgical History:  Procedure Laterality Date  . EYE SURGERY    . KIDNEY STONE SURGERY    . TUBAL LIGATION     There are no active problems to display for this patient.     Prior to Admission medications   Medication Sig Start Date End Date Taking? Authorizing Provider  albuterol (PROVENTIL HFA;VENTOLIN HFA) 108 (90 Base) MCG/ACT inhaler Inhale 2 puffs every 4 (four) hours as needed into the lungs for wheezing or shortness of breath. 12/04/16   Cuthriell, Delorise Royals, PA-C  brompheniramine-pseudoephedrine-DM 30-2-10 MG/5ML syrup Take 10 mLs 4 (four) times daily as needed by mouth. 12/04/16   Cuthriell, Delorise Royals, PA-C  ibuprofen (ADVIL,MOTRIN) 600 MG tablet Take 1 tablet (600 mg total) by mouth every 8 (eight) hours as needed. 02/29/16   Tommi Rumps, PA-C  insulin detemir (LEVEMIR) 100 UNIT/ML injection Inject into the skin daily.    [provider]  insulin regular (NOVOLIN R,HUMULIN R) 100 units/mL injection Inject into the skin 3 (three) times daily before meals.    [provider]  lisinopril (PRINIVIL,ZESTRIL) 10 MG tablet Take 10 mg by mouth daily.    [provider]  naproxen (NAPROSYN) 500 MG tablet Take 1 tablet (500 mg total) by mouth 2 (two) times daily with a meal. 11/13/17   Joni Reining, PA-C  predniSONE (DELTASONE) 50 MG tablet Take 1 tablet (50 mg total) daily with breakfast by mouth. 12/04/16   Cuthriell, Delorise Royals, PA-C  traMADol (ULTRAM) 50 MG tablet Take 1 tablet (50 mg total) by mouth every 12 (twelve) hours as needed. 11/13/17   Joni Reining, PA-C    Allergies Patient has no known allergies.    Social History Social History   Tobacco Use  . Smoking status: Current Every Day Smoker    Packs/day: 0.50    Types: Cigarettes  . Smokeless tobacco: Never Used  Substance Use Topics  . Alcohol use: No  . Drug use: No    Review of Systems Patient denies headaches, rhinorrhea, blurry vision, numbness, shortness of breath, chest pain, edema, cough, abdominal pain, nausea, vomiting, diarrhea, dysuria, fevers, rashes or hallucinations unless otherwise stated above in HPI. ____________________________________________   PHYSICAL EXAM:  VITAL SIGNS: Vitals:   12/30/17 1604 12/30/17 1756  BP: 125/80   Pulse: (!) 109   Resp: 18   Temp: 98.1 F (36.7 C)   SpO2: 100% 100%    Constitutional: Alert and oriented.  Eyes: Conjunctivae are normal.  Head: Atraumatic. Nose:  No congestion/rhinnorhea. Mouth/Throat: Mucous membranes are moist.   Neck: No stridor. Painless ROM.  Cardiovascular: Normal rate, regular rhythm. Grossly normal heart sounds.  Good peripheral circulation. Respiratory: Normal respiratory effort.  No retractions. Lungs CTAB. Gastrointestinal: Soft and nontender. No distention. No abdominal bruits. No CVA tenderness. Genitourinary:  Musculoskeletal: No lower extremity tenderness nor edema.  No joint effusions. Neurologic:  CN- intact.  No facial droop, Normal FNF.  Normal heel to shin.  Sensation  intact bilaterally. Normal speech and language. No gross focal neurologic deficits are appreciated. No gait instability. Skin:  Skin is warm, dry and intact. No rash noted. Psychiatric: Mood and affect are normal. Speech and behavior are normal.  ____________________________________________   LABS (all labs ordered are listed, but only abnormal results are displayed)  Results for orders placed or performed during the hospital encounter of 12/30/17 (from the past 24 hour(s))  Glucose, capillary     Status: Abnormal   Collection Time: 12/30/17  4:12 PM  Result Value Ref Range   Glucose-Capillary 160 (H) 70 - 99 mg/dL  CBC with Differential/Platelet     Status: Abnormal   Collection Time: 12/30/17  6:40 PM  Result Value Ref Range   WBC 11.2 (H) 4.0 - 10.5 K/uL   RBC 4.25 3.87 - 5.11 MIL/uL   Hemoglobin 12.4 12.0 - 15.0 g/dL   HCT 40.938.6 81.136.0 - 91.446.0 %   MCV 90.8 80.0 - 100.0 fL   MCH 29.2 26.0 - 34.0 pg   MCHC 32.1 30.0 - 36.0 g/dL   RDW 78.212.9 95.611.5 - 21.315.5 %   Platelets 367 150 - 400 K/uL   nRBC 0.0 0.0 - 0.2 %   Neutrophils Relative % 58 %   Neutro Abs 6.5 1.7 - 7.7 K/uL   Lymphocytes Relative 30 %   Lymphs Abs 3.3 0.7 - 4.0 K/uL   Monocytes Relative 7 %   Monocytes Absolute 0.8 0.1 - 1.0 K/uL   Eosinophils Relative 4 %   Eosinophils Absolute 0.4 0.0 - 0.5 K/uL   Basophils Relative 1 %   Basophils Absolute 0.1 0.0 - 0.1 K/uL   Immature Granulocytes 0 %   Abs Immature Granulocytes 0.05 0.00 - 0.07 K/uL  Comprehensive metabolic panel     Status: Abnormal   Collection Time: 12/30/17  6:40 PM  Result Value Ref Range   Sodium 136 135 - 145 mmol/L   Potassium 4.1 3.5 - 5.1 mmol/L   Chloride 105 98 - 111 mmol/L   CO2 24 22 - 32 mmol/L   Glucose, Bld 162 (H) 70 - 99 mg/dL   BUN 21 (H) 6 - 20 mg/dL   Creatinine, Ser 0.860.91 0.44 - 1.00 mg/dL   Calcium 8.7 (L) 8.9 - 10.3 mg/dL   Total Protein 7.4 6.5 - 8.1 g/dL   Albumin 4.1 3.5 - 5.0 g/dL   AST 13 (L) 15 - 41 U/L   ALT 14 0 - 44 U/L    Alkaline Phosphatase 61 38 - 126 U/L   Total Bilirubin 0.7 0.3 - 1.2 mg/dL   GFR calc non Af Amer >60 >60 mL/min   GFR calc Af Amer >60 >60 mL/min   Anion gap 7 5 - 15   ____________________________________________ ____________________________________________  RADIOLOGY  I personally reviewed all radiographic images ordered to evaluate for the above acute complaints and reviewed radiology reports and findings.  These findings were personally discussed with the patient.  Please see medical record for radiology report.  ____________________________________________   PROCEDURES  Procedure(s) performed:  Procedures    Critical Care performed: no ____________________________________________   INITIAL IMPRESSION / ASSESSMENT AND PLAN / ED COURSE  Pertinent labs & imaging results that were available during my care of the patient were reviewed by me and considered in my medical decision making (see chart for details).   DDX: dehydration, migraine, sinusitis, mass, unlikely sah or iph, tension, doubt ms  Paula Jennings is a 52 y.o. who presents to the ED with symptoms as described above.  Patient nontoxic-appearing.  No focal neuro deficits.  Certainly does have findings concerning for dehydration.  Based on new headache with no history of headaches or migraines will order CT to exclude mass lesion.  Doubt bleed given lack of meningismus neck stiffness thunderclap headache, hypertension or syncope.  Will give migraine cocktail and reassess.  Clinical Course as of Dec 30 2001  Wynelle Link Dec 30, 2017  9562 CT head shows no acute abnormality.  Not clinically consistent with meningitis or encephalitis.  Possible viral illness.  Will give IV hydration given her lightheadedness and reassess.   [PR]  1918 Some evidence of dehydration based on can profile.  We will continue with IV hydration.   [PR]  1932 Patient reassessed and feels much improved.  Repeat neuro exam is nonfocal.  Patient have an  bili without any symptoms.  Patient stable and appropriate for outpatient follow-up.   [PR]    Clinical Course User Index [PR] Willy Eddy, MD     As part of my medical decision making, I reviewed the following data within the electronic MEDICAL RECORD NUMBER Nursing notes reviewed and incorporated, Labs reviewed, notes from prior ED visits.   ____________________________________________   FINAL CLINICAL IMPRESSION(S) / ED DIAGNOSES  Final diagnoses:  Bad headache  Dehydration      NEW MEDICATIONS STARTED DURING THIS VISIT:  New Prescriptions   No medications on file     Note:  This document was prepared using Dragon voice recognition software and may include unintentional dictation errors.    Willy Eddy, MD 12/30/17 2003

## 2018-06-06 ENCOUNTER — Encounter: Payer: Self-pay | Admitting: Emergency Medicine

## 2018-06-06 ENCOUNTER — Emergency Department
Admission: EM | Admit: 2018-06-06 | Discharge: 2018-06-06 | Disposition: A | Discharge status: Home / Self Care | Payer: Self-pay | Attending: Emergency Medicine | Admitting: Emergency Medicine

## 2018-06-06 ENCOUNTER — Other Ambulatory Visit: Payer: Self-pay

## 2018-06-06 DIAGNOSIS — Z794 Long term (current) use of insulin: Secondary | ICD-10-CM | POA: Insufficient documentation

## 2018-06-06 DIAGNOSIS — E119 Type 2 diabetes mellitus without complications: Secondary | ICD-10-CM | POA: Insufficient documentation

## 2018-06-06 DIAGNOSIS — Z79899 Other long term (current) drug therapy: Secondary | ICD-10-CM | POA: Insufficient documentation

## 2018-06-06 DIAGNOSIS — K047 Periapical abscess without sinus: Secondary | ICD-10-CM | POA: Insufficient documentation

## 2018-06-06 DIAGNOSIS — F17210 Nicotine dependence, cigarettes, uncomplicated: Secondary | ICD-10-CM | POA: Insufficient documentation

## 2018-06-06 MED ORDER — LIDOCAINE VISCOUS HCL 2 % MT SOLN: 10.0000 mL | OROMUCOSAL | 0 refills | Status: DC | PRN

## 2018-06-06 MED ORDER — LIDOCAINE HCL (PF) 1 % IJ SOLN
INTRAMUSCULAR | Status: AC
  Administered 2018-06-06: 2.1 mL
  Filled 2018-06-06: qty 5

## 2018-06-06 MED ORDER — CEFTRIAXONE SODIUM 1 G IJ SOLR
1.0000 g | Freq: Once | INTRAMUSCULAR | Status: AC
  Administered 2018-06-06: 1 g via INTRAMUSCULAR
  Filled 2018-06-06: qty 10

## 2018-06-06 MED ORDER — AMOXICILLIN 500 MG PO CAPS: 500.0000 mg | ORAL_CAPSULE | Freq: Three times a day (TID) | ORAL | 0 refills | Status: DC

## 2018-06-06 NOTE — ED Provider Notes (Signed)
[facility] Emergency Department Provider Note  ____________________________________________  Time seen: Approximately 11:03 AM  I have reviewed the triage vital signs and the nursing notes.   HISTORY  Chief Complaint Dental Pain and Oral Swelling    HPI Paula Jennings is a 53 y.o. female that presents to the emergency department for evaluation of right sided dental pain with swelling for 2 days.  Patient does not have a dentist.  She knows that she needs all of her teeth pulled. No fevers, difficulty opening her mouth.    Past Medical History:  Diagnosis Date  . Diabetes mellitus without complication (HCC)     There are no active problems to display for this patient.   Past Surgical History:  Procedure Laterality Date  . EYE SURGERY    . KIDNEY STONE SURGERY    . TUBAL LIGATION      Prior to Admission medications   Medication Sig Start Date End Date Taking? Authorizing Provider  albuterol (PROVENTIL HFA;VENTOLIN HFA) 108 (90 Base) MCG/ACT inhaler Inhale 2 puffs every 4 (four) hours as needed into the lungs for wheezing or shortness of breath. 12/04/16   Cuthriell, Delorise RoyalsJonathan D, PA-C  amoxicillin (AMOXIL) 500 MG capsule Take 1 capsule (500 mg total) by mouth 3 (three) times daily. 06/06/18   Enid DerryWagner, Sabriyah Wilcher, PA-C  brompheniramine-pseudoephedrine-DM 30-2-10 MG/5ML syrup Take 10 mLs 4 (four) times daily as needed by mouth. 12/04/16   Cuthriell, Delorise RoyalsJonathan D, PA-C  ibuprofen (ADVIL,MOTRIN) 600 MG tablet Take 1 tablet (600 mg total) by mouth every 8 (eight) hours as needed. 02/29/16   Tommi RumpsSummers, Rhonda L, PA-C  insulin detemir (LEVEMIR) 100 UNIT/ML injection Inject into the skin daily.    [provider]  insulin regular (NOVOLIN R,HUMULIN R) 100 units/mL injection Inject into the skin 3 (three) times daily before meals.    [provider]  lidocaine (XYLOCAINE) 2 % solution Use as directed 10 mLs in the mouth or throat as needed. 06/06/18   Enid DerryWagner,  Oumar Marcott, PA-C  lisinopril (PRINIVIL,ZESTRIL) 10 MG tablet Take 10 mg by mouth daily.    [provider]  naproxen (NAPROSYN) 500 MG tablet Take 1 tablet (500 mg total) by mouth 2 (two) times daily with a meal. 11/13/17   Joni ReiningSmith, Ronald K, PA-C  predniSONE (DELTASONE) 50 MG tablet Take 1 tablet (50 mg total) daily with breakfast by mouth. 12/04/16   Cuthriell, Delorise RoyalsJonathan D, PA-C  traMADol (ULTRAM) 50 MG tablet Take 1 tablet (50 mg total) by mouth every 12 (twelve) hours as needed. 11/13/17   Joni ReiningSmith, Ronald K, PA-C    Allergies Patient has no known allergies.  No family history on file.  Social History Social History   Tobacco Use  . Smoking status: Current Every Day Smoker    Packs/day: 0.50    Types: Cigarettes  . Smokeless tobacco: Never Used  Substance Use Topics  . Alcohol use: No  . Drug use: No     Review of Systems  Constitutional: No fever/chills Gastrointestinal:  No nausea, no vomiting.  Musculoskeletal: Negative for musculoskeletal pain. Skin: Negative for rash, abrasions, lacerations, ecchymosis.   ____________________________________________   PHYSICAL EXAM:  VITAL SIGNS: ED Triage Vitals  Enc Vitals Group     BP 06/06/18 1004 (!) 163/80     Pulse Rate 06/06/18 1004 96     Resp --      Temp 06/06/18 1004 98 F (36.7 C)     Temp Source 06/06/18 1004 Oral  SpO2 06/06/18 1004 99 %     Weight 06/06/18 1000 150 lb (68 kg)     Height 06/06/18 1000 5\' 5"  (1.651 m)     Head Circumference --      Peak Flow --      Pain Score 06/06/18 1000 0     Pain Loc --      Pain Edu? --      Excl. in GC? --      Constitutional: Alert and oriented. Well appearing and in no acute distress. Eyes: Conjunctivae are normal. PERRL. EOMI. Head: Atraumatic. ENT:      Ears:      Nose: No congestion/rhinnorhea.      Mouth/Throat: Mucous membranes are moist.  Tenderness to palpation surrounding tooth #4.  Mild swelling to right cheek.  No difficulty opening or closing  mouth. Neck: No stridor. Cardiovascular: Normal rate, regular rhythm.  Good peripheral circulation. Respiratory: Normal respiratory effort without tachypnea or retractions. Lungs CTAB. Good air entry to the bases with no decreased or absent breath sounds. Musculoskeletal: Full range of motion to all extremities. No gross deformities appreciated. Neurologic:  Normal speech and language. No gross focal neurologic deficits are appreciated.  Skin:  Skin is warm, dry and intact. No rash noted. Psychiatric: Mood and affect are normal. Speech and behavior are normal. Patient exhibits appropriate insight and judgement.   ____________________________________________   LABS (all labs ordered are listed, but only abnormal results are displayed)  Labs Reviewed - No data to display ____________________________________________  EKG   ____________________________________________  RADIOLOGY   No results found.  ____________________________________________    PROCEDURES  Procedure(s) performed:    Procedures    Medications  cefTRIAXone (ROCEPHIN) injection 1 g (has no administration in time range)     ____________________________________________   INITIAL IMPRESSION / ASSESSMENT AND PLAN / ED COURSE  Pertinent labs & imaging results that were available during my care of the patient were reviewed by me and considered in my medical decision making (see chart for details).  Review of the [place] CSRS was performed in accordance of the [facility] prior to dispensing any controlled drugs.   Patient's diagnosis is consistent with dental abscess. IM ceftriaxone was given. Patient will be discharged home with prescriptions for amoxicillin and viscous lidocaine. Patient is to follow up with dentist as directed. Dental resources were given. Patient is given ED precautions to return to the ED for any worsening or new symptoms.     ____________________________________________  FINAL CLINICAL  IMPRESSION(S) / ED DIAGNOSES  Final diagnoses:  Dental abscess      NEW MEDICATIONS STARTED DURING THIS VISIT:  ED Discharge Orders         Ordered    amoxicillin (AMOXIL) 500 MG capsule  3 times daily     06/06/18 1121    lidocaine (XYLOCAINE) 2 % solution  As needed     06/06/18 1121              This chart was dictated using voice recognition software/Dragon. Despite best efforts to proofread, errors can occur which can change the meaning. Any change was purely unintentional.    Enid Derry, PA-C 06/06/18 1152    Sharman Cheek, MD 06/07/18 [phone]

## 2018-06-06 NOTE — ED Triage Notes (Signed)
Pt reports toothache to right upper jaw yesterday and slight swelling but states that today swelling is worse.

## 2018-06-06 NOTE — Discharge Instructions (Addendum)
OPTIONS FOR DENTAL FOLLOW UP CARE ° °[place] Department of Health and Human Services - Local Safety Net Dental Clinics °http://www.ncdhhs.gov/dph/oralhealth/services/safetynetclinics.htm °  °[facility] (336-562-3123) ° °[facility] [place] (919-933-9087) ° °[facility] ([phone] ext [phone]) ° °[facility] ([phone]) ° °[facility] ([phone]) °This clinic caters to the indigent population and is on a lottery system. °Location: °[facility], [address], [place] °Clinic Hours: °Wednesdays from 6pm - 9pm, patients seen by a lottery system. °For dates, call or go to [email or web address].[facility].edu/[facility]/patients/Dental-[facility] °Services: °Cleanings, fillings and simple extractions. °Payment Options: °DENTAL WORK IS FREE OF CHARGE. Bring proof of income or support. °Best way to get seen: °Arrive at 5:15 pm - this is a lottery, NOT first come/first serve, so arriving earlier will not increase your chances of being seen. °  °  °[facility] °[phone] °Select option 1 for emergencies °  °Location: °[facility], [address], [place] °Clinic Hours: °No walk-ins accepted - call the day before to schedule an appointment. °Check in times are 9:30 am and 1:30 pm. °Services: °Simple extractions, temporary fillings, pulpectomy/pulp debridement, uncomplicated abscess drainage. °Payment Options: °PAYMENT IS DUE AT THE TIME OF SERVICE.  Fee is usually $100-200, additional surgical procedures (e.g. abscess drainage) may be extra. °Cash, checks, Visa/MasterCard accepted.  Can file Medicaid if patient is covered for dental - patient should call case worker to check. °No discount for [facility] patients. °Best way to get seen: °MUST call the day before and get onto the schedule. Can usually be seen the next 1-2 days. No walk-ins accepted. °  °  °[facility] °[phone] °   °Location: °[facility], [address], [place] °Clinic Hours: °M, W, Th, F 8am or 1:30pm, Tues 9a or 1:30 - first come/first served. °Services: °Simple extractions, temporary fillings, uncomplicated abscess drainage.  You do not need to be an [place] resident. °Payment Options: °PAYMENT IS DUE AT THE TIME OF SERVICE. °Dental insurance, otherwise sliding scale - bring proof of income or support. °Depending on income and treatment needed, cost is usually $50-200. °Best way to get seen: °Arrive early as it is first come/first served. °  °  °[facility] °[phone] °  °Location: °[address] °Clinic Hours: °Mon-Thu 8a-5p °Services: °Most basic dental services including extractions and fillings. °Payment Options: °PAYMENT IS DUE AT THE TIME OF SERVICE. °Sliding scale, up to 50% off - bring proof if income or support. °Medicaid with dental option accepted. °Best way to get seen: °Call to schedule an appointment, can usually be seen within 2 weeks OR they will try to see walk-ins - show up at 8a or 2p (you may have to wait). °  °  °[facility] °[phone] °[place] RESIDENTS ONLY °  °Location: °[facility], [address], [place] [address] °Clinic Hours: By appointment only. °Monday - Thursday 8am-5pm, Friday 8am-12pm °Services: Cleanings, fillings, extractions. °Payment Options: °PAYMENT IS DUE AT THE TIME OF SERVICE. °Cash, Visa or MasterCard. Sliding scale - $30 minimum per service. °Best way to get seen: °Come in to office, complete packet and make an appointment - need proof of income °or support monies for each household member and proof of [place] residence. °Usually takes about a month to get in. °  °  °[facility] °[phone] °  °Location: °[address]   [place] °Clinic Hours: Walk-in Urgent Care Dental Services are offered Monday-Friday  mornings only. °The numbers of emergencies accepted daily is limited to the number of °providers available. °Maximum 15 - Mondays, Wednesdays & Thursdays °Maximum 10 - Tuesdays & Fridays °Services: °You do not need to be a [place] resident to be seen for a dental emergency. °Emergencies are defined as pain, swelling, abnormal bleeding, or dental trauma. Walkins will receive x-rays if needed. °NOTE: Dental cleaning is not an emergency. °Payment Options: °PAYMENT IS DUE AT THE TIME OF SERVICE. °Minimum co-pay is $40.00 for uninsured patients. °Minimum co-pay is $3.00 for Medicaid with dental coverage. °Dental Insurance is accepted and must be presented at time of visit. °Medicare does not cover dental. °Forms of payment: Cash, credit card, checks. °Best way to get seen: °If not previously registered with the clinic, walk-in dental registration begins at 7:15 am and is on a first come/first serve basis. °If previously registered with the clinic, call to make an appointment. °  °  °[facility] °[phone] °[place] RESIDENTS ONLY °  °Location: °[address], [place], [place] °Clinic Hours: °Mon-Thu 10a-2p °Services: Extractions only! °Payment Options: °FREE (donations accepted) - bring proof of income or support °Best way to get seen: °Call and schedule an appointment OR come at 8am on the 1st Monday of every month (except for holidays) when it is first come/first served. °  °  °[facility] °[phone] °  °Location: °[address], [place] °Clinic Hours: °Friday mornings °Services, Payment Options, Best way to get seen: °Call for info °

## 2018-10-19 ENCOUNTER — Other Ambulatory Visit: Payer: Self-pay

## 2018-11-11 ENCOUNTER — Emergency Department: Payer: 59

## 2018-11-11 ENCOUNTER — Emergency Department
Admission: EM | Admit: 2018-11-11 | Discharge: 2018-11-12 | Disposition: A | Discharge status: Home / Self Care | Payer: 59 | Attending: Student in an Organized Health Care Education/Training Program | Admitting: Student in an Organized Health Care Education/Training Program

## 2018-11-11 ENCOUNTER — Other Ambulatory Visit: Payer: Self-pay

## 2018-11-11 DIAGNOSIS — Z79899 Other long term (current) drug therapy: Secondary | ICD-10-CM | POA: Diagnosis not present

## 2018-11-11 DIAGNOSIS — F17210 Nicotine dependence, cigarettes, uncomplicated: Secondary | ICD-10-CM | POA: Diagnosis not present

## 2018-11-11 DIAGNOSIS — G609 Hereditary and idiopathic neuropathy, unspecified: Secondary | ICD-10-CM

## 2018-11-11 DIAGNOSIS — E119 Type 2 diabetes mellitus without complications: Secondary | ICD-10-CM | POA: Diagnosis not present

## 2018-11-11 DIAGNOSIS — G9009 Other idiopathic peripheral autonomic neuropathy: Secondary | ICD-10-CM | POA: Insufficient documentation

## 2018-11-11 DIAGNOSIS — R200 Anesthesia of skin: Secondary | ICD-10-CM | POA: Diagnosis present

## 2018-11-11 DIAGNOSIS — Z794 Long term (current) use of insulin: Secondary | ICD-10-CM | POA: Diagnosis not present

## 2018-11-11 DIAGNOSIS — R202 Paresthesia of skin: Secondary | ICD-10-CM | POA: Diagnosis not present

## 2018-11-11 LAB — CBC
HCT: 39.2 % (ref 36.0–46.0)
Hemoglobin: 12.7 g/dL (ref 12.0–15.0)
MCH: 29.4 pg (ref 26.0–34.0)
MCHC: 32.4 g/dL (ref 30.0–36.0)
MCV: 90.7 fL (ref 80.0–100.0)
Platelets: 372 10*3/uL (ref 150–400)
RBC: 4.32 MIL/uL (ref 3.87–5.11)
RDW: 12.3 % (ref 11.5–15.5)
WBC: 10.4 10*3/uL (ref 4.0–10.5)
nRBC: 0 % (ref 0.0–0.2)

## 2018-11-11 LAB — COMPREHENSIVE METABOLIC PANEL
ALT: 15 U/L (ref 0–44)
AST: 13 U/L — ABNORMAL LOW (ref 15–41)
Albumin: 3.9 g/dL (ref 3.5–5.0)
Alkaline Phosphatase: 73 U/L (ref 38–126)
Anion gap: 9 (ref 5–15)
BUN: 16 mg/dL (ref 6–20)
CO2: 23 mmol/L (ref 22–32)
Calcium: 8.7 mg/dL — ABNORMAL LOW (ref 8.9–10.3)
Chloride: 101 mmol/L (ref 98–111)
Creatinine, Ser: 0.67 mg/dL (ref 0.44–1.00)
GFR calc Af Amer: >60 mL/min (ref >60)
GFR calc non Af Amer: >60 mL/min (ref >60)
Glucose, Bld: 178 mg/dL — ABNORMAL HIGH (ref 70–99)
Potassium: 3.9 mmol/L (ref 3.5–5.1)
Sodium: 133 mmol/L — ABNORMAL LOW (ref 135–145)
Total Bilirubin: 0.7 mg/dL (ref 0.3–1.2)
Total Protein: 7.3 g/dL (ref 6.5–8.1)

## 2018-11-11 NOTE — ED Triage Notes (Signed)
Pt states she woke up at 0700 this am with hand numbness states from right elbow to hand. Hx of the same but symptoms subsided. States today symptoms have not improved, denies any other numbness or complaints.

## 2018-11-11 NOTE — ED Notes (Signed)
Pt reports she woke this AM with her right arm "asleep" and all day today, pt noticed numbness and tingling in right arm, left hand and left foot. Pt denies injury. Normal neuro exam.

## 2018-11-11 NOTE — ED Provider Notes (Signed)
[facility] Emergency Department Provider Note    First MD Initiated Contact with Patient 11/11/18 2154     (approximate)  I have reviewed the triage vital signs and the nursing notes.   HISTORY  Chief Complaint Numbness    HPI Paula Jennings is a 53 y.o. female history of diabetes was well as migraine headaches and smoking history presents the ER for initially right-sided hand tingling and heaviness that occurred when she woke up this morning.  States that throughout the day she has had tingling and paresthesias going in her both forearms and hands as well as her left foot.  Is never had symptoms like this before.  Denies any headache.  No neck pain.  No previous history of stroke.  No recent fevers.  No nausea or vomiting.  States that symptoms will come and go throughout the day lasting roughly 30 to 45 minutes.  Not positional.  No recent trauma.    Past Medical History:  Diagnosis Date  . Diabetes mellitus without complication ([place])    No family history on file. Past Surgical History:  Procedure Laterality Date  . EYE SURGERY    . KIDNEY STONE SURGERY    . TUBAL LIGATION     There are no active problems to display for this patient.     Prior to Admission medications   Medication Sig Start Date End Date Taking? Authorizing Provider  albuterol (PROVENTIL HFA;VENTOLIN HFA) 108 (90 Base) MCG/ACT inhaler Inhale 2 puffs every 4 (four) hours as needed into the lungs for wheezing or shortness of breath. 12/04/16   Cuthriell, Charline Bills, PA-C  amoxicillin (AMOXIL) 500 MG capsule Take 1 capsule (500 mg total) by mouth 3 (three) times daily. 06/06/18   Laban Emperor, PA-C  brompheniramine-pseudoephedrine-DM 30-2-10 MG/5ML syrup Take 10 mLs 4 (four) times daily as needed by mouth. 12/04/16   Cuthriell, Charline Bills, PA-C  ibuprofen (ADVIL,MOTRIN) 600 MG tablet Take 1 tablet (600 mg total) by mouth every 8 (eight) hours as needed. 02/29/16   Johnn Hai, PA-C   insulin detemir (LEVEMIR) 100 UNIT/ML injection Inject into the skin daily.    [provider]  insulin regular (NOVOLIN R,HUMULIN R) 100 units/mL injection Inject into the skin 3 (three) times daily before meals.    [provider]  lidocaine (XYLOCAINE) 2 % solution Use as directed 10 mLs in the mouth or throat as needed. 06/06/18   Laban Emperor, PA-C  lisinopril (PRINIVIL,ZESTRIL) 10 MG tablet Take 10 mg by mouth daily.    [provider]  naproxen (NAPROSYN) 500 MG tablet Take 1 tablet (500 mg total) by mouth 2 (two) times daily with a meal. 11/13/17   Sable Feil, PA-C  predniSONE (DELTASONE) 50 MG tablet Take 1 tablet (50 mg total) daily with breakfast by mouth. 12/04/16   Cuthriell, Charline Bills, PA-C  traMADol (ULTRAM) 50 MG tablet Take 1 tablet (50 mg total) by mouth every 12 (twelve) hours as needed. 11/13/17   Sable Feil, PA-C    Allergies Patient has no known allergies.    Social History Social History   Tobacco Use  . Smoking status: Current Every Day Smoker    Packs/day: 0.50    Types: Cigarettes  . Smokeless tobacco: Never Used  Substance Use Topics  . Alcohol use: No  . Drug use: No    Review of Systems Patient denies headaches, rhinorrhea, blurry vision, numbness, shortness of breath, chest pain, edema, cough, abdominal pain, nausea,  vomiting, diarrhea, dysuria, fevers, rashes or hallucinations unless otherwise stated above in HPI. ____________________________________________   PHYSICAL EXAM:  VITAL SIGNS: Vitals:   11/11/18 1958  BP: (!) 141/68  Pulse: (!) 101  Resp: 16  Temp: 98.5 F (36.9 C)  SpO2: 100%    Constitutional: Alert and oriented.  Eyes: Conjunctivae are normal.  Head: Atraumatic. Nose: No congestion/rhinnorhea. Mouth/Throat: Mucous membranes are moist.   Neck: No stridor. Painless ROM.  Cardiovascular: Normal rate, regular rhythm. Grossly normal heart sounds.  Good peripheral circulation.  Respiratory: Normal respiratory effort.  No retractions. Lungs CTAB. Gastrointestinal: Soft and nontender. No distention. No abdominal bruits. No CVA tenderness. Genitourinary:  Musculoskeletal: No lower extremity tenderness nor edema.  No joint effusions. Neurologic:  CN- intact.  No facial droop, Normal FNF.  Normal heel to shin.  Sensation intact bilaterally. Normal speech and language. No gross focal neurologic deficits are appreciated.  Subjective decrease in sensation to light touch of right hand and forearm No gait instability. Skin:  Skin is warm, dry and intact. No rash noted. Psychiatric: Mood and affect are normal. Speech and behavior are normal.  ____________________________________________   LABS (all labs ordered are listed, but only abnormal results are displayed)  Results for orders placed or performed during the hospital encounter of 11/11/18 (from the past 24 hour(s))  CBC     Status: None   Collection Time: 11/11/18  8:11 PM  Result Value Ref Range   WBC 10.4 4.0 - 10.5 K/uL   RBC 4.32 3.87 - 5.11 MIL/uL   Hemoglobin 12.7 12.0 - 15.0 g/dL   HCT 16.139.2 09.636.0 - 04.546.0 %   MCV 90.7 80.0 - 100.0 fL   MCH 29.4 26.0 - 34.0 pg   MCHC 32.4 30.0 - 36.0 g/dL   RDW 40.912.3 81.111.5 - 91.415.5 %   Platelets 372 150 - 400 K/uL   nRBC 0.0 0.0 - 0.2 %  Comprehensive metabolic panel     Status: Abnormal   Collection Time: 11/11/18  8:11 PM  Result Value Ref Range   Sodium 133 (L) 135 - 145 mmol/L   Potassium 3.9 3.5 - 5.1 mmol/L   Chloride 101 98 - 111 mmol/L   CO2 23 22 - 32 mmol/L   Glucose, Bld 178 (H) 70 - 99 mg/dL   BUN 16 6 - 20 mg/dL   Creatinine, Ser 7.820.67 0.44 - 1.00 mg/dL   Calcium 8.7 (L) 8.9 - 10.3 mg/dL   Total Protein 7.3 6.5 - 8.1 g/dL   Albumin 3.9 3.5 - 5.0 g/dL   AST 13 (L) 15 - 41 U/L   ALT 15 0 - 44 U/L   Alkaline Phosphatase 73 38 - 126 U/L   Total Bilirubin 0.7 0.3 - 1.2 mg/dL   GFR calc non Af Amer >60 >60 mL/min   GFR calc Af Amer >60 >60 mL/min   Anion gap 9  5 - 15   ____________________________________________  EKG My review and personal interpretation at Time: 17:05   Indication: paresthesia  Rate: 90  Rhythm: sinus Axis: normal Other: normal intervals, no stemi ____________________________________________  RADIOLOGY  I personally reviewed all radiographic images ordered to evaluate for the above acute complaints and reviewed radiology reports and findings.  These findings were personally discussed with the patient.  Please see medical record for radiology report.  ____________________________________________   PROCEDURES  Procedure(s) performed:  Procedures    Critical Care performed: no ____________________________________________   INITIAL IMPRESSION / ASSESSMENT AND PLAN / ED COURSE  Pertinent labs & imaging results that were available during my care of the patient were reviewed by me and considered in my medical decision making (see chart for details).   DDX: paresthesia, carpal tunnel, ms, complex migraine, tia, cva  Minola Guin is a 53 y.o. who presents to the ED with symptoms as described above.  Patient pleasant well-appearing in no acute distress.  Currently does not have any pain.  No objective neuro deficits.  CT head is normal.  Given her constellation of findings will order MRI to further evaluate as the symptoms seem to be new so would want to rule out MS, CVA or mass.  If MRI negative, patient will be appropriate for further workup and as an outpatient.demyelinating process such as MS or CVA.  If MRI is negative patient will be stable and appropriate for outpatient follow-up.  Demyelinating process such as MS or CVA or mass.  A CT demyelinating process such as MS or CVA or mass.  If CT     The patient was evaluated in Emergency Department today for the symptoms described in the history of present illness. He/she was evaluated in the context of the global COVID-19 pandemic, which necessitated consideration that  the patient might be at risk for infection with the SARS-CoV-2 virus that causes COVID-19. Institutional protocols and algorithms that pertain to the evaluation of patients at risk for COVID-19 are in a state of rapid change based on information released by regulatory bodies including the CDC and federal and state organizations. These policies and algorithms were followed during the patient's care in the ED.  As part of my medical decision making, I reviewed the following data within the electronic MEDICAL RECORD NUMBER Nursing notes reviewed and incorporated, Labs reviewed, notes from prior ED visits and [place] Controlled Substance Database   ____________________________________________   FINAL CLINICAL IMPRESSION(S) / ED DIAGNOSES  Final diagnoses:  Paresthesia      NEW MEDICATIONS STARTED DURING THIS VISIT:  New Prescriptions   No medications on file     Note:  This document was prepared using Dragon voice recognition software and may include unintentional dictation errors.    Willy Eddy, MD 11/11/18 2253

## 2018-11-11 NOTE — ED Notes (Signed)
Pt in hallway bed.  Pt on cell phone.  Pt reports numbness in right hand to right elbow.  No headache.  No n/v/.  No facial droop.  Pt alert  Speech clear.

## 2018-11-11 NOTE — ED Notes (Signed)
Patient transported to MRI 

## 2018-11-12 NOTE — ED Notes (Signed)
Pt signed esignature.  D/c inst to pt.  Pt alert   Speech clear.  Pt ambulating without diff.

## 2018-11-12 NOTE — ED Provider Notes (Signed)
-----------------------------------------   12:24 AM on 11/12/2018 -----------------------------------------  MRI brain interpreted per Dr. Nevada Crane: 1. No acute intracranial abnormality.  2. Mild for age nonspecific cerebral white matter signal changes,  most commonly due to chronic small vessel disease.   MRI cervical spine interpreted per Dr. Nevada Crane: Overall mild cervical disc degeneration. There is mild spinal  stenosis with up to mild spinal cord mass effect at C5-C6 related to  disc, but no convincing C6 foraminal stenosis and no spinal cord  signal abnormality.   Updated patient of MRI results.  Will refer to both neurology as well as neurosurgery for outpatient follow-up.  Strict return precautions given.  Patient verbalizes understanding and agrees with plan of care.   Paulette Blanch, MD 11/12/18 [phone]

## 2018-11-12 NOTE — Discharge Instructions (Signed)
Please call the numbers above to follow-up with both neurologist as well as neurosurgeon.  Return to the ER for worsening symptoms, persistent vomiting, extremity weakness or other concerns.

## 2018-11-22 ENCOUNTER — Encounter: Payer: Self-pay | Admitting: Family Medicine

## 2018-11-22 ENCOUNTER — Ambulatory Visit: Payer: 59 | Admitting: Family Medicine

## 2018-11-22 ENCOUNTER — Other Ambulatory Visit: Payer: Self-pay

## 2018-11-22 VITALS — BP 118/74 | HR 98 | Temp 98.3°F | Resp 14 | Ht 65.0 in | Wt 153.2 lb

## 2018-11-22 DIAGNOSIS — M4802 Spinal stenosis, cervical region: Secondary | ICD-10-CM

## 2018-11-22 DIAGNOSIS — Z794 Long term (current) use of insulin: Secondary | ICD-10-CM | POA: Diagnosis not present

## 2018-11-22 DIAGNOSIS — IMO0002 Reserved for concepts with insufficient information to code with codable children: Secondary | ICD-10-CM

## 2018-11-22 DIAGNOSIS — E1165 Type 2 diabetes mellitus with hyperglycemia: Secondary | ICD-10-CM

## 2018-11-22 DIAGNOSIS — E1140 Type 2 diabetes mellitus with diabetic neuropathy, unspecified: Secondary | ICD-10-CM | POA: Diagnosis not present

## 2018-11-22 DIAGNOSIS — I10 Essential (primary) hypertension: Secondary | ICD-10-CM

## 2018-11-22 DIAGNOSIS — B351 Tinea unguium: Secondary | ICD-10-CM | POA: Diagnosis not present

## 2018-11-22 DIAGNOSIS — Z1231 Encounter for screening mammogram for malignant neoplasm of breast: Secondary | ICD-10-CM

## 2018-11-22 DIAGNOSIS — Z23 Encounter for immunization: Secondary | ICD-10-CM

## 2018-11-22 DIAGNOSIS — Z720 Tobacco use: Secondary | ICD-10-CM

## 2018-11-22 LAB — COMPLETE METABOLIC PANEL WITH GFR
AG Ratio: 1.3 (calc) (ref 1.0–2.5)
ALT: 10 U/L (ref 6–29)
AST: 11 U/L (ref 10–35)
Albumin: 4 g/dL (ref 3.6–5.1)
Alkaline phosphatase (APISO): 65 U/L (ref 37–153)
BUN: 11 mg/dL (ref 7–25)
CO2: 25 mmol/L (ref 20–32)
Calcium: 8.9 mg/dL (ref 8.6–10.4)
Chloride: 102 mmol/L (ref 98–110)
Creat: 0.61 mg/dL (ref 0.50–1.05)
GFR, Est African American: 123 mL/min/{1.73_m2} (ref >=60)
GFR, Est Non African American: 106 mL/min/{1.73_m2} (ref >=60)
Globulin: 3.2 g/dL (calc) (ref 1.9–3.7)
Glucose, Bld: 293 mg/dL — ABNORMAL HIGH (ref 65–99)
Potassium: 5.1 mmol/L (ref 3.5–5.3)
Sodium: 135 mmol/L (ref 135–146)
Total Bilirubin: 0.7 mg/dL (ref 0.2–1.2)
Total Protein: 7.2 g/dL (ref 6.1–8.1)

## 2018-11-22 LAB — POCT GLYCOSYLATED HEMOGLOBIN (HGB A1C): Hemoglobin A1C: 10 % — AB (ref 4.0–5.6)

## 2018-11-22 LAB — POCT UA - MICROALBUMIN: Microalbumin Ur, POC: 20 mg/L

## 2018-11-22 MED ORDER — ATORVASTATIN CALCIUM 20 MG PO TABS: 20.0000 mg | ORAL_TABLET | Freq: Every day | ORAL | 5 refills | Status: DC

## 2018-11-22 MED ORDER — ATORVASTATIN CALCIUM 20 MG PO TABS: 20.0000 mg | ORAL_TABLET | Freq: Every day | ORAL | 5 refills | Status: AC

## 2018-11-22 MED ORDER — LISINOPRIL 5 MG PO TABS: 5.0000 mg | ORAL_TABLET | Freq: Every day | ORAL | 3 refills | Status: AC

## 2018-11-22 MED ORDER — LISINOPRIL 5 MG PO TABS: 5.0000 mg | ORAL_TABLET | Freq: Every day | ORAL | 3 refills | Status: DC

## 2018-11-22 NOTE — Progress Notes (Signed)
Name: [name]   MRN: [id number]    DOB: 19-Feb-1965   Date:11/22/2018       Progress Note  Chief Complaint  Patient presents with  . Establish Care  . Nail Problem    rt big toe  . Diabetes     Subjective:   Paula Jennings is a 53 y.o. female, presents to clinic for routine follow up on the conditions listed above.  Patient is here to establish care was previously seen at [facility] clinic.  Her mother is a patient here so she wanted to try and come here with her to make it easier to be seen.  She currently is working but does not have insurance for 9 more months until she qualifies.  IDDM -  Not on any meds right now, is supposed to be on levemir and novolog She did not tolerate Metformin at all. Has a meter but not strips or lancets - believes its accucheck - not checking blood sugars She's been working on her diet to try and help her DM.  She has not been to the doctor for it in about a year, she has been takign care of her MIL who is very ill and shes "let herself fall apart", very motivated now to address her healthy so she doesn't end up ill like her MIL Lost some weight, 15 lbs over the past year, does endorse urinary frequency, nocturia, polydipsia, but denies fatigue polyphagia Was on gabapentin in the past for neuropathy, didn't help much   Recent pertinent labs: Lab Results  Component Value Date   HGBA1C 10.0 (A) 11/22/2018   Current diet: in general, a "healthy" diet   Current exercise: none  Not - UTD on DM foot exam and eye exam ACEI/ARB: No - did tolerate in the past, Statin: No - did tolerate in the past   Current smoker, hx of bronchitis - does not use inhaler daily.  Gets bronchitis maybe once a year.  She has no chronic cough, no unintentional weight loss, wheeze, chest pain, night sweats  Toe pain - right great toe pain with thickened long deformed toe - yellow thick toenail, is painful and she is afraid that the toenails going to fall off she  does continue to bump it because it is very thick and long Has hx of neuropathy -to her bilateral lower extremities she was on gabapentin in the past but it did nothing for her pain.  HTN - was on lisinopril -patient states she has been off medications for over a year  Hx of Spinal stenosis - cervical spine with radiculopathy in right hand   Patient Active Problem List   Diagnosis Date Noted  . Uncontrolled diabetes mellitus with diabetic neuropathy, with long-term current use of insulin (HCC) 11/25/2018  . Tobacco abuse 11/25/2018  . Hypertension 11/25/2018    Past Surgical History:  Procedure Laterality Date  . EYE SURGERY    . KIDNEY STONE SURGERY    . TUBAL LIGATION      Family History  Problem Relation Age of Onset  . Diabetes Mother   . Heart disease Father   . Hypertension Father   . Hypertension Sister   . Diabetes Maternal Grandfather   . Heart disease Maternal Grandfather   . Diabetes Paternal Grandmother     Social History   Socioeconomic History  . Marital status: Married    Spouse name: Rusty  . Number of children: 4  . Years of education: 26  .  Highest education level: Associate degree: occupational, Scientist, product/process development, or vocational program  Occupational History  . Not on file  Social Needs  . Financial resource strain: Not hard at all  . Food insecurity    Worry: Never true    Inability: Never true  . Transportation needs    Medical: No    Non-medical: No  Tobacco Use  . Smoking status: Current Every Day Smoker    Packs/day: 0.50    Types: Cigarettes  . Smokeless tobacco: Never Used  Substance and Sexual Activity  . Alcohol use: No  . Drug use: No  . Sexual activity: Yes  Lifestyle  . Physical activity    Days per week: 2 days    Minutes per session: 40 min  . Stress: Not at all  Relationships  . Social connections    Talks on phone: More than three times a week    Gets together: More than three times a week    Attends religious service:  Never    Active member of club or organization: No    Attends meetings of clubs or organizations: Never    Relationship status: Married  . Intimate partner violence    Fear of current or ex partner: No    Emotionally abused: No    Physically abused: No    Forced sexual activity: No  Other Topics Concern  . Not on file  Social History Narrative  . Not on file     Current Outpatient Medications:  .  albuterol (PROVENTIL HFA;VENTOLIN HFA) 108 (90 Base) MCG/ACT inhaler, Inhale 2 puffs every 4 (four) hours as needed into the lungs for wheezing or shortness of breath., Disp: 1 Inhaler, Rfl: 0 .  amoxicillin (AMOXIL) 500 MG capsule, Take 1 capsule (500 mg total) by mouth 3 (three) times daily., Disp: 30 capsule, Rfl: 0 .  brompheniramine-pseudoephedrine-DM 30-2-10 MG/5ML syrup, Take 10 mLs 4 (four) times daily as needed by mouth., Disp: 200 mL, Rfl: 0 .  ibuprofen (ADVIL,MOTRIN) 600 MG tablet, Take 1 tablet (600 mg total) by mouth every 8 (eight) hours as needed., Disp: 30 tablet, Rfl: 0 .  insulin detemir (LEVEMIR) 100 UNIT/ML injection, Inject into the skin daily., Disp: , Rfl:  .  insulin regular (NOVOLIN R,HUMULIN R) 100 units/mL injection, Inject into the skin 3 (three) times daily before meals., Disp: , Rfl:  .  lidocaine (XYLOCAINE) 2 % solution, Use as directed 10 mLs in the mouth or throat as needed., Disp: 100 mL, Rfl: 0 .  lisinopril (PRINIVIL,ZESTRIL) 10 MG tablet, Take 10 mg by mouth daily., Disp: , Rfl:  .  naproxen (NAPROSYN) 500 MG tablet, Take 1 tablet (500 mg total) by mouth 2 (two) times daily with a meal., Disp: 20 tablet, Rfl: 00 .  predniSONE (DELTASONE) 50 MG tablet, Take 1 tablet (50 mg total) daily with breakfast by mouth., Disp: 5 tablet, Rfl: 0 .  traMADol (ULTRAM) 50 MG tablet, Take 1 tablet (50 mg total) by mouth every 12 (twelve) hours as needed., Disp: 12 tablet, Rfl: 0  No Known Allergies  I personally reviewed active problem list, medication list, allergies,  family history, social history, health maintenance, notes from last encounter, lab results, imaging with the patient/caregiver today.  Review of Systems  Constitutional: Negative.  Negative for activity change, appetite change, chills, diaphoresis, fatigue, fever and unexpected weight change.  HENT: Negative.   Eyes: Negative.   Respiratory: Negative.  Negative for shortness of breath.   Cardiovascular: Negative.  Negative for  chest pain, palpitations and leg swelling.  Gastrointestinal: Negative.  Negative for abdominal pain and blood in stool.  Endocrine: Positive for polydipsia and polyuria.  Genitourinary: Negative.   Musculoskeletal: Negative.  Negative for arthralgias, gait problem, joint swelling and myalgias.  Skin: Negative.  Negative for color change, pallor and rash.  Allergic/Immunologic: Negative.   Neurological: Negative.  Negative for dizziness, syncope, weakness, light-headedness and headaches.  Hematological: Negative.   Psychiatric/Behavioral: Negative.  Negative for confusion, dysphoric mood, self-injury and suicidal ideas. The patient is not nervous/anxious.      Objective:    Vitals:   11/22/18 0904  BP: 118/74  Pulse: 98  Resp: 14  Temp: 98.3 F (36.8 C)  SpO2: 98%  Weight: 153 lb 3.2 oz (69.5 kg)  Height: 5\' 5"  (1.651 m)    Body mass index is 25.49 kg/m.  Physical Exam Vitals signs and nursing note reviewed.  Constitutional:      General: She is not in acute distress.    Appearance: Normal appearance. She is well-developed. She is not ill-appearing, toxic-appearing or diaphoretic.     Interventions: Face mask in place.  HENT:     Head: Normocephalic and atraumatic.     Right Ear: External ear normal.     Left Ear: External ear normal.     Nose: Nose normal.     Mouth/Throat:     Mouth: Mucous membranes are dry.     Pharynx: Oropharynx is clear.  Eyes:     General: Lids are normal. No scleral icterus.       Right eye: No discharge.         Left eye: No discharge.     Conjunctiva/sclera: Conjunctivae normal.  Neck:     Musculoskeletal: Normal range of motion and neck supple.     Trachea: Phonation normal. No tracheal deviation.  Cardiovascular:     Rate and Rhythm: Normal rate and regular rhythm.     Pulses: Normal pulses.          Radial pulses are 2+ on the right side and 2+ on the left side.       Dorsalis pedis pulses are 2+ on the right side and 2+ on the left side.       Posterior tibial pulses are 2+ on the right side and 2+ on the left side.     Heart sounds: Normal heart sounds. No murmur. No friction rub. No gallop.   Pulmonary:     Effort: Pulmonary effort is normal. No respiratory distress.     Breath sounds: Normal breath sounds. No stridor. No wheezing, rhonchi or rales.  Chest:     Chest wall: No tenderness.  Abdominal:     General: Bowel sounds are normal. There is no distension.     Palpations: Abdomen is soft.     Tenderness: There is no abdominal tenderness. There is no guarding or rebound.  Musculoskeletal: Normal range of motion.        General: No deformity.     Right lower leg: No edema.     Left lower leg: No edema.     Right foot: Normal range of motion.     Left foot: Normal range of motion.  Feet:     Right foot:     Skin integrity: Skin integrity normal. No ulcer, blister, skin breakdown, erythema, warmth, callus, dry skin or fissure.     Toenail Condition: Right toenails are abnormally thick and long. Fungal disease present.  Left foot:     Skin integrity: Skin integrity normal. No ulcer, blister, skin breakdown, erythema, warmth, callus, dry skin or fissure.  Lymphadenopathy:     Cervical: No cervical adenopathy.  Skin:    General: Skin is warm and dry.     Capillary Refill: Capillary refill takes less than 2 seconds.     Coloration: Skin is not jaundiced or pale.     Findings: No rash.  Neurological:     Mental Status: She is alert and oriented to person, place, and time.      Motor: No abnormal muscle tone.     Gait: Gait normal.  Psychiatric:        Mood and Affect: Mood normal.        Speech: Speech normal.        Behavior: Behavior normal.      Diabetic Foot Exam: See PE above - completed today  PHQ2/9: Depression screen [facility] 2/9 11/22/2018  Decreased Interest 0  Down, Depressed, Hopeless 0  PHQ - 2 Score 0  Altered sleeping 0  Tired, decreased energy 0  Change in appetite 0  Feeling bad or failure about yourself  0  Trouble concentrating 0  Moving slowly or fidgety/restless 0  Suicidal thoughts 0  PHQ-9 Score 0  Difficult doing work/chores Not difficult at all    phq 9 is negative Reviewed today, negative, doing well  Fall Risk: Fall Risk  11/22/2018  Falls in the past year? 0  Number falls in past yr: 0  Injury with Fall? 0      Functional Status Survey: Is the patient deaf or have difficulty hearing?: No Does the patient have difficulty seeing, even when wearing glasses/contacts?: No Does the patient have difficulty concentrating, remembering, or making decisions?: No Does the patient have difficulty walking or climbing stairs?: No Does the patient have difficulty dressing or bathing?: No Does the patient have difficulty doing errands alone such as visiting a doctor's office or shopping?: No    Assessment & Plan:     1. Uncontrolled diabetes mellitus with diabetic neuropathy, with long-term current use of insulin (HCC) A1C today 10, which is great, we both expected it to be higher - Pt refuses to try Metformin again, was previously on basal and mealtime insulin will restart a daily basal insulin with available samples today, patient was given Evaristo Bury, instructed to start with 10 unit SQ daily injection in the morning at the same time a day and encouraged to follow her blood sugars fasting in the morning and another in the afternoon or evening.  I do not believe she will need mealtime insulin.  Without insurance most other oral  medications will be too expensive.  Put in referral for endocrinology to see if she can get charity care through [facility] she was given application today.  Also put in chronic care management services to see if there is anything they can do to help get her some financial support or help her apply for medicines to help with cost. Patient agrees to restart statin and ACE inhibitor To try minimize her cost for this appointment today due to cash pay will only get CMP in addition to the point-of-care labs we did - POCT HgB A1C - COMPLETE METABOLIC PANEL WITH GFR - POCT UA - Microalbumin - Ambulatory referral to Chronic Care Management Services - Ambulatory referral to Endocrinology - Ambulatory referral to Podiatry - atorvastatin (LIPITOR) 20 MG tablet; Take 1 tablet (20 mg total) by mouth  at bedtime.  Dispense: 30 tablet; Refill: 5 - lisinopril (ZESTRIL) 5 MG tablet; Take 1 tablet (5 mg total) by mouth daily.  Dispense: 90 tablet; Refill: 3  2. Hypertension, unspecified type BP well controlled w/o meds, will restart low dose lisinopril for IDDM for renal protection  3. Encounter for screening mammogram for breast cancer [facility] of [facility] to help get mammograms covered - MM 3D SCREEN BREAST BILATERAL; Future  4. Spinal stenosis of cervical region Hx of - requesting records  5. Fungal infection of toenail Right great toe nail - probably needs nail to be removed, but would want specialist to do so since pt has IDDM uncontrolled, would be concerned to do in office here with lacking supplies, discussed infection, f/up here if any signs of infection, [facility] - see if she qualifies for help with lack on insurance. - Ambulatory referral to Podiatry  6. Need for influenza vaccination doe  7. Tobacco abuse Smoking cessation instruction/counseling given:  counseled patient on the dangers of tobacco use, advised patient to stop smoking, and reviewed strategies to  maximize success  Pamphlet given, pt not interested in quitting right now  Encouraged to f/up in 2-4 weeks to recheck CBG's  Danelle BerryLeisa Jaclin Finks, PA-C 11/22/18 9:16 AM

## 2018-11-22 NOTE — Patient Instructions (Addendum)
Lets start with the sample pen - tresiba and start with 10 unit dose every morning the same time of day. Get a new meter and strips and check blood sugar at least 2x a day   Hypoglycemia Hypoglycemia is when the sugar (glucose) level in your blood is too low. Signs of low blood sugar may include:  Feeling: ? Hungry. ? Worried or nervous (anxious). ? Sweaty and clammy. ? Confused. ? Dizzy. ? Sleepy. ? Sick to your stomach (nauseous).  Having: ? A fast heartbeat. ? A headache. ? A change in your vision. ? Tingling or no feeling (numbness) around your mouth, lips, or tongue. ? Jerky movements that you cannot control (seizure).  Having trouble with: ? Moving (coordination). ? Sleeping. ? Passing out (fainting). ? Getting upset easily (irritability). Low blood sugar can happen to people who have diabetes and people who do not have diabetes. Low blood sugar can happen quickly, and it can be an emergency. Treating low blood sugar Low blood sugar is often treated by eating or drinking something sugary right away, such as:  Fruit juice, 4-6 oz (120-150 mL).  Regular soda (not diet soda), 4-6 oz (120-150 mL).  Low-fat milk, 4 oz (120 mL).  Several pieces of hard candy.  Sugar or honey, 1 Tbsp (15 mL). Treating low blood sugar if you have diabetes If you can think clearly and swallow safely, follow the 15:15 rule:  Take 15 grams of a fast-acting carb (carbohydrate). Talk with your doctor about how much you should take.  Always keep a source of fast-acting carb with you, such as: ? Sugar tablets (glucose pills). Take 3-4 pills. ? 6-8 pieces of hard candy. ? 4-6 oz (120-150 mL) of fruit juice. ? 4-6 oz (120-150 mL) of regular (not diet) soda. ? 1 Tbsp (15 mL) honey or sugar.  Check your blood sugar 15 minutes after you take the carb.  If your blood sugar is still at or below 70 mg/dL (3.9 mmol/L), take 15 grams of a carb again.  If your blood sugar does not go above 70  mg/dL (3.9 mmol/L) after 3 tries, get help right away.  After your blood sugar goes back to normal, eat a meal or a snack within 1 hour.  Treating very low blood sugar If your blood sugar is at or below 54 mg/dL (3 mmol/L), you have very low blood sugar (severe hypoglycemia). This may also cause:  Passing out.  Jerky movements you cannot control (seizure).  Losing consciousness (coma). This is an emergency. Do not wait to see if the symptoms will go away. Get medical help right away. Call your local emergency services (911 in the [place] Do not drive yourself to the hospital. If you have very low blood sugar and you cannot eat or drink, you may need a glucagon shot (injection). A family member or friend should learn how to check your blood sugar and how to give you a glucagon shot. Ask your doctor if you need to have a glucagon shot kit at home. Follow these instructions at home: General instructions  Take over-the-counter and prescription medicines only as told by your doctor.  Stay aware of your blood sugar as told by your doctor.  Limit alcohol intake to no more than 1 drink a day for nonpregnant women and 2 drinks a day for men. One drink equals 12 oz of beer (355 mL), 5 oz of wine (148 mL), or 1 oz of hard liquor (44 mL).  Keep  all follow-up visits as told by your doctor. This is important. If you have diabetes:   Follow your diabetes care plan as told by your doctor. Make sure you: ? Know the signs of low blood sugar. ? Take your medicines as told. ? Follow your exercise and meal plan. ? Eat on time. Do not skip meals. ? Check your blood sugar as often as told by your doctor. Always check it before and after exercise. ? Follow your sick day plan when you cannot eat or drink normally. Make this plan ahead of time with your doctor.  Share your diabetes care plan with: ? Your work or school. ? People you live with.  Check your pee (urine) for ketones: ? When you are  sick. ? As told by your doctor.  Carry a card or wear jewelry that says you have diabetes. Contact a doctor if:  You have trouble keeping your blood sugar in your target range.  You have low blood sugar often. Get help right away if:  You still have symptoms after you eat or drink something sugary.  Your blood sugar is at or below 54 mg/dL (3 mmol/L).  You have jerky movements that you cannot control.  You pass out. These symptoms may be an emergency. Do not wait to see if the symptoms will go away. Get medical help right away. Call your local emergency services (911 in the [place] Do not drive yourself to the hospital. Summary  Hypoglycemia happens when the level of sugar (glucose) in your blood is too low.  Low blood sugar can happen to people who have diabetes and people who do not have diabetes. Low blood sugar can happen quickly, and it can be an emergency.  Make sure you know the signs of low blood sugar and know how to treat it.  Always keep a source of sugar (fast-acting carb) with you to treat low blood sugar. This information is not intended to replace advice given to you by your health care provider. Make sure you discuss any questions you have with your health care provider. Document Released: 04/12/2009 Document Revised: 05/09/2018 Document Reviewed: 02/19/2015 Elsevier Patient Education  [employer].  Insulin Degludec injection What is this medicine? INSULIN DEGLUDEC (IN su lin de GLOO dek) is a human-made form of insulin. This drug lowers the amount of sugar in your blood. It is a long-acting insulin that is usually given once a day. This medicine may be used for other purposes; ask your health care provider or pharmacist if you have questions. COMMON BRAND NAME(S): Tyler Aas What should I tell my health care provider before I take this medicine? They need to know if you have any of these conditions:  episodes of low blood sugar  eye disease, vision  problems  kidney disease  liver disease  an unusual or allergic reaction to insulin, other medicines, foods, dyes, or preservatives  pregnant or trying to get pregnant  breast-feeding How should I use this medicine? This medicine is for injection under the skin. Use exactly as directed. This insulin should never be mixed in the same syringe with other insulins before injection. Do not vigorously shake before use. You will be taught how to use this medicine and how to adjust doses for activities and illness. Do not use more insulin than prescribed. Always check the appearance of your insulin before using it. This medicine should be clear and colorless like water. Do not use it if it is cloudy, thickened, colored,  or has solid particles in it. If you use an insulin pen, be sure to take off the outer needle cover before using the dose. It is important that you put your used needles and syringes in a special sharps container. Do not put them in a trash can. If you do not have a sharps container, call your pharmacist or healthcare provider to get one. Talk to your pediatrician regarding the use of this medicine in children. While this drug may be prescribed for children as young as 1 year for selected conditions, precautions do apply. Overdosage: If you think you have taken too much of this medicine contact a poison control center or emergency room at once. NOTE: This medicine is only for you. Do not share this medicine with others. What if I miss a dose? For adults: If you miss a dose, take it as soon as you can. Make sure your next dose is taken at least 8 hours later. Do not take double or extra doses. For adolescents and children: It is important not to miss a dose. Your health care professional or doctor should discuss a plan for missed doses with you. If you do miss a dose, follow their plan. Do not take double doses. What may interact with this medicine?  other medicines for diabetes Many  medications may cause changes in blood sugar, these include:  alcohol containing beverages  antiviral medicines for HIV or AIDS  aspirin and aspirin-like drugs  certain medicines for blood pressure, heart disease, irregular heart beat  chromium  diuretics  female hormones, such as estrogens or progestins, birth control pills  fenofibrate  gemfibrozil  isoniazid  lanreotide  female hormones or anabolic steroids  MAOIs like Carbex, Eldepryl, Marplan, Nardil, and Parnate  medicines for weight loss  medicines for allergies, asthma, cold, or cough  medicines for depression, anxiety, or psychotic disturbances  niacin  nicotine  NSAIDs, medicines for pain and inflammation, like ibuprofen or naproxen  octreotide  pasireotide  pentamidine  phenytoin  probenecid  quinolone antibiotics such as ciprofloxacin, levofloxacin, ofloxacin  some herbal dietary supplements  steroid medicines such as prednisone or cortisone  sulfamethoxazole; trimethoprim  thyroid hormones Some medications can hide the warning symptoms of low blood sugar (hypoglycemia). You may need to monitor your blood sugar more closely if you are taking one of these medications. These include:  beta-blockers, often used for high blood pressure or heart problems (examples include atenolol, metoprolol, propranolol)  clonidine  guanethidine  reserpine This list may not describe all possible interactions. Give your health care provider a list of all the medicines, herbs, non-prescription drugs, or dietary supplements you use. Also tell them if you smoke, drink alcohol, or use illegal drugs. Some items may interact with your medicine. What should I watch for while using this medicine? Visit your health care professional or doctor for regular checks on your progress. A test called the HbA1C (A1C) will be monitored. This is a simple blood test. It measures your blood sugar control over the last 2 to 3  months. You will receive this test every 3 to 6 months. Learn how to check your blood sugar. Learn the symptoms of low and high blood sugar and how to manage them. Always carry a quick-source of sugar with you in case you have symptoms of low blood sugar. Examples include hard sugar candy or glucose tablets. Make sure others know that you can choke if you eat or drink when you develop serious symptoms of low  blood sugar, such as seizures or unconsciousness. They must get medical help at once. Tell your doctor or health care professional if you have high blood sugar. You might need to change the dose of your medicine. If you are sick or exercising more than usual, you might need to change the dose of your medicine. Do not skip meals. Ask your doctor or health care professional if you should avoid alcohol. Many nonprescription cough and cold products contain sugar or alcohol. These can affect blood sugar. Make sure that you have the right kind of syringe for the type of insulin you use. Try not to change the brand and type of insulin or syringe unless your health care professional or doctor tells you to. Switching insulin brand or type can cause dangerously high or low blood sugar. Always keep an extra supply of insulin, syringes, and needles on hand. Use a syringe one time only. Throw away syringe and needle in a closed container to prevent accidental needle sticks. Insulin pens and cartridges should never be shared. Even if the needle is changed, sharing may result in passing of viruses like hepatitis or HIV. Each time you get a new box of pen needles, check to see if they are the same type as the ones you were trained to use. If not, ask your health care professional to show you how to use this new type properly. Wear a medical ID bracelet or chain, and carry a card that describes your disease and details of your medicine and dosage times. What side effects may I notice from receiving this medicine? Side  effects that you should report to your doctor or health care professional as soon as possible:  allergic reactions like skin rash, itching or hives, swelling of the face, lips, or tongue  breathing problems  signs and symptoms of high blood sugar such as dizziness, dry mouth, dry skin, fruity breath, nausea, stomach pain, increased hunger or thirst, increased urination  signs and symptoms of low blood sugar such as feeling anxious, confusion, dizziness, increased hunger, unusually weak or tired, sweating, shakiness, cold, irritable, headache, blurred vision, fast heartbeat, loss of consciousness Side effects that usually do not require medical attention (report to your doctor or health care professional if they continue or are bothersome):  increase or decrease in fatty tissue under the skin due to overuse of a particular injection site  itching, burning, swelling, or rash at site where injected This list may not describe all possible side effects. Call your doctor for medical advice about side effects. You may report side effects to FDA at 1-800-FDA-1088. Where should I keep my medicine? Keep out of the reach of children. Unopened Vials: Tresiba vials: Store in a refrigerator between 2 and 8 degrees C (36 and 46 degrees F) or at room temperature below 30 degrees C (86 degrees F). Do not freeze or use if the insulin has been frozen. Protect from light and excessive heat. If stored at room temperature, the vial must be discarded after 56 days (8 weeks). Throw away any unopened and unused medicine that has been stored in the refrigerator after the expiration date. Unopened Pens: [place] FlexTouch pens: Store in a refrigerator between 2 and 8 degrees C (36 and 46 degrees F) or at room temperature below 30 degrees C (86 degrees F). Do not freeze or use if the insulin has been frozen. Protect from light and excessive heat. If stored at room temperature, the pen must be discarded after 56 days (  8  weeks). Throw away any unopened and unused medicine that has been stored in the refrigerator after the expiration date. Vials that you are using: Tresiba vials: Store in a refrigerator or at room temperature below 30 degrees C (86 degrees F). Do not freeze. Keep away from heat and light. Throw the opened vial away after 56 days (8 weeks). Pens that you are using: [place] FlexTouch pens: Store in a refrigerator or at room temperature below 30 degrees C (86 degrees F). Do not freeze. Keep away from heat and light. Throw the pen away after 56 days (8 weeks), even if it still has insulin left in it. NOTE: This sheet is a summary. It may not cover all possible information. If you have questions about this medicine, talk to your doctor, pharmacist, or health care provider.  2020 Elsevier/Gold Standard (2018-04-22 16:59:04)

## 2018-11-25 ENCOUNTER — Encounter: Payer: Self-pay | Admitting: Family Medicine

## 2018-11-25 DIAGNOSIS — I10 Essential (primary) hypertension: Secondary | ICD-10-CM | POA: Insufficient documentation

## 2018-11-25 DIAGNOSIS — Z794 Long term (current) use of insulin: Secondary | ICD-10-CM | POA: Insufficient documentation

## 2018-11-25 DIAGNOSIS — IMO0002 Reserved for concepts with insufficient information to code with codable children: Secondary | ICD-10-CM | POA: Insufficient documentation

## 2018-11-25 DIAGNOSIS — E1140 Type 2 diabetes mellitus with diabetic neuropathy, unspecified: Secondary | ICD-10-CM | POA: Insufficient documentation

## 2018-11-25 DIAGNOSIS — Z720 Tobacco use: Secondary | ICD-10-CM | POA: Insufficient documentation

## 2018-11-27 ENCOUNTER — Ambulatory Visit: Payer: Self-pay | Admitting: *Deleted

## 2018-11-27 DIAGNOSIS — IMO0002 Reserved for concepts with insufficient information to code with codable children: Secondary | ICD-10-CM

## 2018-11-27 DIAGNOSIS — E1165 Type 2 diabetes mellitus with hyperglycemia: Secondary | ICD-10-CM

## 2018-11-27 DIAGNOSIS — E1140 Type 2 diabetes mellitus with diabetic neuropathy, unspecified: Secondary | ICD-10-CM

## 2018-11-27 NOTE — Chronic Care Management (AMB) (Signed)
  Care Management   Social Work Note  11/27/2018 Name: Paula Jennings MRN: [id number] DOB: 23-Nov-1965  Paula Jennings is a 53 y.o. year old female who sees Delsa Grana, [place] for primary care. The CCM team was consulted for assistance with [employer] .  Patient referred to [facility] services for assistance with community resources related to need to follow up with specialist, however has no insurance coverage at this time.  Patient referred to the [facility] for further assistance.  Outpatient Encounter Medications as of 11/27/2018  Medication Sig  . atorvastatin (LIPITOR) 20 MG tablet Take 1 tablet (20 mg total) by mouth at bedtime.  Marland Kitchen lisinopril (ZESTRIL) 5 MG tablet Take 1 tablet (5 mg total) by mouth daily.   No facility-administered encounter medications on file as of 11/27/2018.     Goals Addressed   None     Follow Up Plan: Client will follow up with the community care team to address her community resource needs  Elliot Gurney, [facility] Care Management [phone]

## 2018-12-02 ENCOUNTER — Telehealth: Payer: Self-pay

## 2018-12-02 NOTE — Telephone Encounter (Signed)
Copied from [place] [phone]. Topic: Referral - Status >> Dec 02, 2018  3:00 PM Simone Curia D wrote: 76/03/2631 Left message on voicemail for patient to return my call regarding an assister for enrolling in the [place]. Ambrose Mantle [phone]

## 2018-12-04 ENCOUNTER — Telehealth: Payer: Self-pay

## 2018-12-04 NOTE — Telephone Encounter (Signed)
Copied from [place] [phone]. Topic: Referral - Status >> Dec 04, 2018  1:75 PM Simone Curia D wrote: 11/01/5850 Spoke with patient about [facility] to help her apply for insurance. Ambrose Mantle [phone]

## 2018-12-16 ENCOUNTER — Telehealth: Payer: Self-pay

## 2018-12-16 NOTE — Telephone Encounter (Signed)
Copied from [place] [phone]. Topic: Referral - Status >> Dec 16, 2018  0:45 PM Simone Curia D wrote: Damaris Schooner with patient, reminded patient to call [place]. The assister can help patient enroll in a health plan or apply for Medicaid.  Ambrose Mantle [phone]

## 2019-05-05 LAB — HEMOGLOBIN A1C: Hemoglobin A1C: 10.6

## 2019-05-27 ENCOUNTER — Emergency Department
Admission: EM | Admit: 2019-05-27 | Discharge: 2019-05-27 | Disposition: A | Discharge status: Home / Self Care | Payer: 59 | Attending: Emergency Medicine | Admitting: Emergency Medicine

## 2019-05-27 ENCOUNTER — Other Ambulatory Visit: Payer: Self-pay

## 2019-05-27 DIAGNOSIS — Z79899 Other long term (current) drug therapy: Secondary | ICD-10-CM | POA: Insufficient documentation

## 2019-05-27 DIAGNOSIS — E119 Type 2 diabetes mellitus without complications: Secondary | ICD-10-CM | POA: Diagnosis not present

## 2019-05-27 DIAGNOSIS — R55 Syncope and collapse: Secondary | ICD-10-CM | POA: Diagnosis present

## 2019-05-27 DIAGNOSIS — F17210 Nicotine dependence, cigarettes, uncomplicated: Secondary | ICD-10-CM | POA: Insufficient documentation

## 2019-05-27 DIAGNOSIS — Z794 Long term (current) use of insulin: Secondary | ICD-10-CM | POA: Diagnosis not present

## 2019-05-27 LAB — CBC
HCT: 37.8 % (ref 36.0–46.0)
Hemoglobin: 12.6 g/dL (ref 12.0–15.0)
MCH: 29.9 pg (ref 26.0–34.0)
MCHC: 33.3 g/dL (ref 30.0–36.0)
MCV: 89.6 fL (ref 80.0–100.0)
Platelets: 329 10*3/uL (ref 150–400)
RBC: 4.22 MIL/uL (ref 3.87–5.11)
RDW: 12.5 % (ref 11.5–15.5)
WBC: 12.1 10*3/uL — ABNORMAL HIGH (ref 4.0–10.5)
nRBC: 0 % (ref 0.0–0.2)

## 2019-05-27 LAB — URINALYSIS, COMPLETE (UACMP) WITH MICROSCOPIC
Bilirubin Urine: NEGATIVE
Glucose, UA: 50 mg/dL — AB
Hgb urine dipstick: NEGATIVE
Ketones, ur: NEGATIVE mg/dL
Nitrite: NEGATIVE
Protein, ur: NEGATIVE mg/dL
Specific Gravity, Urine: 1.02 (ref 1.005–1.030)
pH: 6 (ref 5.0–8.0)

## 2019-05-27 LAB — BASIC METABOLIC PANEL
Anion gap: 6 (ref 5–15)
BUN: 20 mg/dL (ref 6–20)
CO2: 25 mmol/L (ref 22–32)
Calcium: 8.8 mg/dL — ABNORMAL LOW (ref 8.9–10.3)
Chloride: 102 mmol/L (ref 98–111)
Creatinine, Ser: 0.69 mg/dL (ref 0.44–1.00)
GFR calc Af Amer: >60 mL/min (ref >60)
GFR calc non Af Amer: >60 mL/min (ref >60)
Glucose, Bld: 163 mg/dL — ABNORMAL HIGH (ref 70–99)
Potassium: 4.1 mmol/L (ref 3.5–5.1)
Sodium: 133 mmol/L — ABNORMAL LOW (ref 135–145)

## 2019-05-27 LAB — POCT PREGNANCY, URINE: Preg Test, Ur: NEGATIVE

## 2019-05-27 MED ORDER — SODIUM CHLORIDE 0.9% FLUSH: 3.0000 mL | Freq: Once | INTRAVENOUS | Status: DC

## 2019-05-27 NOTE — ED Provider Notes (Signed)
[facility] Emergency Department Provider Note  ____________________________________________  Time seen: Approximately 4:49 PM  I have reviewed the triage vital signs and the nursing notes.   HISTORY  Chief Complaint Near Syncope    HPI Paula Jennings is a 54 y.o. female with a history of diabetes and peripheral neuropathy who was in her usual state of health until about 2:00 PM today when she was standing in line for a smoothie when she suddenly felt nauseated, lightheaded, dark in vision, felt like she was going to pass out.  The feeling lasted for a few minutes and then resolved, she now feels back to normal except for a small spot in the right side of her vision.  She is compliant with insulin therapy.  She checked her blood sugar when this happened and it was 130.  She was in her usual state of health when she woke up this morning, ate breakfast, went to the gym for the first time in a long time and did some exercise.  No preceding CP, SOB, palpitations, thunderclap headache or lateralizing neuro symptoms.        Past Medical History:  Diagnosis Date  . Diabetes mellitus without complication (HCC)   . Neuropathy   . Spinal stenosis      Patient Active Problem List   Diagnosis Date Noted  . Uncontrolled diabetes mellitus with diabetic neuropathy, with long-term current use of insulin (HCC) 11/25/2018  . Tobacco abuse 11/25/2018  . Hypertension 11/25/2018     Past Surgical History:  Procedure Laterality Date  . EYE SURGERY    . KIDNEY STONE SURGERY    . TUBAL LIGATION       Prior to Admission medications   Medication Sig Start Date End Date Taking? Authorizing Provider  atorvastatin (LIPITOR) 20 MG tablet Take 1 tablet (20 mg total) by mouth at bedtime. 11/22/18   Danelle Berry, PA-C  lisinopril (ZESTRIL) 5 MG tablet Take 1 tablet (5 mg total) by mouth daily. 11/22/18   Danelle Berry, PA-C     Allergies Patient has no known  allergies.   Family History  Problem Relation Age of Onset  . Diabetes Mother   . Heart disease Father   . Hypertension Father   . Hypertension Sister   . Diabetes Maternal Grandfather   . Heart disease Maternal Grandfather   . Diabetes Paternal Grandmother     Social History Social History   Tobacco Use  . Smoking status: Current Every Day Smoker    Packs/day: 0.50    Types: Cigarettes  . Smokeless tobacco: Never Used  Substance Use Topics  . Alcohol use: No  . Drug use: No    Review of Systems  Constitutional:   No fever or chills.  ENT:   No sore throat. No rhinorrhea. Cardiovascular:   No chest pain or syncope. Respiratory:   No dyspnea or cough. Gastrointestinal:   Negative for abdominal pain, vomiting and diarrhea.  Musculoskeletal:   Negative for focal pain or swelling All other systems reviewed and are negative except as documented above in ROS and HPI.  ____________________________________________   PHYSICAL EXAM:  VITAL SIGNS: ED Triage Vitals [05/27/19 1426]  Enc Vitals Group     BP (!) 130/56     Pulse Rate (!) 101     Resp 16     Temp 98.6 F (37 C)     Temp Source Oral     SpO2 99 %     Weight 154 lb (  69.9 kg)     Height 5\' 5"  (1.651 m)     Head Circumference      Peak Flow      Pain Score 4     Pain Loc      Pain Edu?      Excl. in GC?     Vital signs reviewed, nursing assessments reviewed.   Constitutional:   Alert and oriented. Non-toxic appearance. Eyes:   Conjunctivae are normal. EOMI. PERRL. ENT      Head:   Normocephalic and atraumatic.      Nose:   Wearing a mask.      Mouth/Throat:   Wearing a mask.      Neck:   No meningismus. Full ROM. Hematological/Lymphatic/Immunilogical:   No cervical lymphadenopathy. Cardiovascular:   RRR. Symmetric bilateral radial and DP pulses.  No murmurs. Cap refill less than 2 seconds. Respiratory:   Normal respiratory effort without tachypnea/retractions. Breath sounds are clear and equal  bilaterally. No wheezes/rales/rhonchi. Gastrointestinal:   Soft and nontender. Non distended. There is no CVA tenderness.  No rebound, rigidity, or guarding.  Musculoskeletal:   Normal range of motion in all extremities. No joint effusions.  No lower extremity tenderness.  No edema. Neurologic:   Normal speech and language. No pronator drift, normal coordination and balance Cranial nerves II through XII intact Steady gait Motor grossly intact. No acute focal neurologic deficits are appreciated.  Skin:    Skin is warm, dry and intact. No rash noted.  No petechiae, purpura, or bullae.  ____________________________________________    LABS (pertinent positives/negatives) (all labs ordered are listed, but only abnormal results are displayed) Labs Reviewed  BASIC METABOLIC PANEL - Abnormal; Notable for the following components:      Result Value   Sodium 133 (*)    Glucose, Bld 163 (*)    Calcium 8.8 (*)    All other components within normal limits  CBC - Abnormal; Notable for the following components:   WBC 12.1 (*)    All other components within normal limits  URINALYSIS, COMPLETE (UACMP) WITH MICROSCOPIC - Abnormal; Notable for the following components:   Color, Urine YELLOW (*)    APPearance CLOUDY (*)    Glucose, UA 50 (*)    Leukocytes,Ua SMALL (*)    Bacteria, UA FEW (*)    All other components within normal limits  POCT PREGNANCY, URINE  CBG MONITORING, ED  POC URINE PREG, ED   ____________________________________________   EKG  Interpreted by me  Date: 05/27/2019  Rate: 100  Rhythm: normal sinus rhythm  QRS Axis: normal  Intervals: normal  ST/T Wave abnormalities: normal  Conduction Disutrbances: none  Narrative Interpretation: unremarkable      ____________________________________________    RADIOLOGY  No results  found.  ____________________________________________   PROCEDURES Procedures  ____________________________________________    CLINICAL IMPRESSION / ASSESSMENT AND PLAN / ED COURSE  Medications ordered in the ED: Medications  sodium chloride flush (NS) 0.9 % injection 3 mL (has no administration in time range)    Pertinent labs & imaging results that were available during my care of the patient were reviewed by me and considered in my medical decision making (see chart for details).  Paula Jennings was evaluated in Emergency Department on 05/27/2019 for the symptoms described in the history of present illness. She was evaluated in the context of the global COVID-19 pandemic, which necessitated consideration that the patient might be at risk for infection with the SARS-CoV-2 virus that causes COVID-19.  Institutional protocols and algorithms that pertain to the evaluation of patients at risk for COVID-19 are in a state of rapid change based on information released by regulatory bodies including the CDC and federal and state organizations. These policies and algorithms were followed during the patient's care in the ED.   Patient presents with episode of near syncope, likely due to a degree of dehydration, orthostatic symptoms, vasodilation from warm weather today, possibly vagal episode.  No red flag symptoms, doubt acute cardiac, neurologic, or vascular event such as stroke PE ACS dissection ICH aneurysm.  Stable for discharge and follow-up with primary care.  Recommended rest and aggressive hydration the rest of the day.      ____________________________________________   FINAL CLINICAL IMPRESSION(S) / ED DIAGNOSES    Final diagnoses:  Near syncope     ED Discharge Orders    None      Portions of this note were generated with dragon dictation software. Dictation errors may occur despite best attempts at proofreading.   Carrie Mew, MD 05/27/19 [phone]

## 2019-05-27 NOTE — ED Triage Notes (Signed)
Was standing in line at [employer] and she lost vision and felt like passing out.  Then she came here.  This was bilateral vision.  Says she can see a lot better, but one spot in left eye still there.  Happened about 30 min ago

## 2019-08-12 ENCOUNTER — Other Ambulatory Visit: Payer: Self-pay

## 2019-08-12 ENCOUNTER — Encounter: Payer: Self-pay | Admitting: Emergency Medicine

## 2019-08-12 ENCOUNTER — Emergency Department: Payer: 59

## 2019-08-12 ENCOUNTER — Emergency Department
Admission: EM | Admit: 2019-08-12 | Discharge: 2019-08-12 | Disposition: A | Discharge status: Home / Self Care | Payer: 59 | Attending: Emergency Medicine | Admitting: Emergency Medicine

## 2019-08-12 DIAGNOSIS — F17210 Nicotine dependence, cigarettes, uncomplicated: Secondary | ICD-10-CM | POA: Insufficient documentation

## 2019-08-12 DIAGNOSIS — Y9269 Other specified industrial and construction area as the place of occurrence of the external cause: Secondary | ICD-10-CM | POA: Insufficient documentation

## 2019-08-12 DIAGNOSIS — W208XXA Other cause of strike by thrown, projected or falling object, initial encounter: Secondary | ICD-10-CM | POA: Insufficient documentation

## 2019-08-12 DIAGNOSIS — I10 Essential (primary) hypertension: Secondary | ICD-10-CM | POA: Insufficient documentation

## 2019-08-12 DIAGNOSIS — Y998 Other external cause status: Secondary | ICD-10-CM | POA: Insufficient documentation

## 2019-08-12 DIAGNOSIS — S9032XA Contusion of left foot, initial encounter: Secondary | ICD-10-CM | POA: Insufficient documentation

## 2019-08-12 DIAGNOSIS — Z794 Long term (current) use of insulin: Secondary | ICD-10-CM | POA: Insufficient documentation

## 2019-08-12 DIAGNOSIS — Y9389 Activity, other specified: Secondary | ICD-10-CM | POA: Insufficient documentation

## 2019-08-12 DIAGNOSIS — E1140 Type 2 diabetes mellitus with diabetic neuropathy, unspecified: Secondary | ICD-10-CM | POA: Insufficient documentation

## 2019-08-12 DIAGNOSIS — Z79899 Other long term (current) drug therapy: Secondary | ICD-10-CM | POA: Insufficient documentation

## 2019-08-12 MED ORDER — TRAMADOL HCL 50 MG PO TABS: 50.0000 mg | ORAL_TABLET | Freq: Four times a day (QID) | ORAL | 0 refills | Status: DC | PRN

## 2019-08-12 NOTE — ED Triage Notes (Signed)
Patient ambulatory to triage with steady gait, without difficulty or distress noted; pt reports yesterday while working at [employer], a pasta drawer fell on her left foot; pt denies desire to file workers comp at this time

## 2019-08-12 NOTE — Discharge Instructions (Addendum)
Please rest ice and elevate the left foot.  Apply ice 20 minutes every hour.  Take ibuprofen 800 mg 3 times daily with food x5 days as needed.  You may also use tramadol and Tylenol for additional pain relief.

## 2019-08-12 NOTE — ED Provider Notes (Signed)
[facility] EMERGENCY [facility]PARTMENT Provider Note   CSN: [id number] Arrival date & time: 08/12/19  1937     History Chief Complaint  Patient presents with   Foot Injury    Paula Jennings is a 54 y.o. female presents to the emergency department evaluation of left foot injury.  Patient states a metal door fell onto her left foot yesterday.  She continued to work today.  Has had a lot of pain that was unresponsive to 400 mg of ibuprofen 3 times daily.  Patient's pain is moderate.  No numbness or tingling.  No other injury to her body.  HPI     Past Medical History:  Diagnosis Date   Diabetes mellitus without complication (HCC)    Neuropathy    Spinal stenosis     Patient Active Problem List   Diagnosis Date Noted   Uncontrolled diabetes mellitus with diabetic neuropathy, with long-term current use of insulin (HCC) 11/25/2018   Tobacco abuse 11/25/2018   Hypertension 11/25/2018    Past Surgical History:  Procedure Laterality Date   EYE SURGERY     KIDNEY STONE SURGERY     TUBAL LIGATION       OB History    Gravida  4   Para      Term      Preterm      AB      Living  4     SAB      TAB      Ectopic      Multiple      Live Births              Family History  Problem Relation Age of Onset   Diabetes Mother    Heart disease Father    Hypertension Father    Hypertension Sister    Diabetes Maternal Grandfather    Heart disease Maternal Grandfather    Diabetes Paternal Grandmother     Social History   Tobacco Use   Smoking status: Current Every Day Smoker    Packs/day: 0.50    Types: Cigarettes   Smokeless tobacco: Never Used  Vaping Use   Vaping Use: Never used  Substance Use Topics   Alcohol use: No   Drug use: No    Home Medications Prior to Admission medications   Medication Sig Start Date End Date Taking? Authorizing Provider  atorvastatin (LIPITOR) 20 MG tablet Take 1 tablet (20 mg  total) by mouth at bedtime. 11/22/18   Danelle Berry, PA-C  lisinopril (ZESTRIL) 5 MG tablet Take 1 tablet (5 mg total) by mouth daily. 11/22/18   Danelle Berry, PA-C  traMADol (ULTRAM) 50 MG tablet Take 1 tablet (50 mg total) by mouth every 6 (six) hours as needed. 08/12/19 08/11/20  Evon Slack, PA-C    Allergies    Patient has no known allergies.  Review of Systems   Review of Systems  Constitutional: Negative for fever.  Musculoskeletal: Positive for arthralgias and gait problem. Negative for joint swelling.  Skin: Negative for rash and wound.    Physical Exam Updated Vital Signs Ht 5\' 5"  (1.651 m)    Wt 70.3 kg    LMP 07/22/2019 (Exact Date)    BMI 25.79 kg/m   Physical Exam Constitutional:      Appearance: She is well-developed.  HENT:     Head: Normocephalic and atraumatic.  Eyes:     Conjunctiva/sclera: Conjunctivae normal.  Cardiovascular:     Rate and Rhythm:  Normal rate.  Pulmonary:     Effort: Pulmonary effort is normal. No respiratory distress.  Musculoskeletal:        General: Normal range of motion.     Cervical back: Normal range of motion.     Comments: Left foot tender along the dorsal aspect of the digits.  No deformity, skin breakdown noted.  Very minimal ecchymosis noted along the distal metatarsals.  Patient nontender throughout the ankle.  Skin:    General: Skin is warm.     Findings: No rash.  Neurological:     General: No focal deficit present.     Mental Status: She is alert and oriented to person, place, and time.  Psychiatric:        Behavior: Behavior normal.        Thought Content: Thought content normal.     ED Results / Procedures / Treatments   Labs (all labs ordered are listed, but only abnormal results are displayed) Labs Reviewed - No data to display  EKG None  Radiology DG Foot Complete Left  Result Date: 08/12/2019 CLINICAL DATA:  Injured at work yesterday EXAM: LEFT FOOT - COMPLETE 3+ VIEW COMPARISON:  11/13/2017  FINDINGS: Frontal, oblique, lateral views of the left foot are obtained. No fracture, subluxation, or dislocation. 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 Small inferior calcaneal spur. Soft tissues are unremarkable. IMPRESSION: 1. Stable osteoarthritis.  No acute fracture. Electronically Signed   By: Sharlet Salina M.D.   On: 08/12/2019 20:51    Procedures Procedures (including critical care time)  Medications Ordered in ED Medications - No data to display  ED Course  I have reviewed the triage vital signs and the nursing notes.  Pertinent labs & imaging results that were available during my care of the patient were reviewed by me and considered in my medical decision making (see chart for details).    MDM Rules/Calculators/A&P                          54 year old female with left foot contusion.  X-rays negative for any fracture.  She has been ambulatory and has been working today.  I offered her crutches or walker but she is okay, states she has the next 2 days off.  She will increase ibuprofen to 800 mg 3 times daily with food and will take tramadol as needed for more severe pain.  She understands signs symptoms return to the ER for. Final Clinical Impression(s) / ED Diagnoses Final diagnoses:  Contusion of left foot, initial encounter    Rx / DC Orders ED Discharge Orders         Ordered    traMADol (ULTRAM) 50 MG tablet  Every 6 hours PRN     Discontinue  Reprint     08/12/19 2203           Evon Slack, PA-C 08/12/19 2206    Chesley Noon, MD 08/12/19 2321

## 2019-12-21 ENCOUNTER — Encounter: Payer: Self-pay | Admitting: Intensive Care

## 2019-12-21 ENCOUNTER — Other Ambulatory Visit: Payer: Self-pay

## 2019-12-21 ENCOUNTER — Emergency Department
Admission: EM | Admit: 2019-12-21 | Discharge: 2019-12-21 | Disposition: A | Discharge status: Home / Self Care | Payer: 59 | Attending: Emergency Medicine | Admitting: Emergency Medicine

## 2019-12-21 DIAGNOSIS — F17210 Nicotine dependence, cigarettes, uncomplicated: Secondary | ICD-10-CM | POA: Diagnosis not present

## 2019-12-21 DIAGNOSIS — Z79899 Other long term (current) drug therapy: Secondary | ICD-10-CM | POA: Diagnosis not present

## 2019-12-21 DIAGNOSIS — G5601 Carpal tunnel syndrome, right upper limb: Secondary | ICD-10-CM | POA: Insufficient documentation

## 2019-12-21 DIAGNOSIS — I10 Essential (primary) hypertension: Secondary | ICD-10-CM | POA: Diagnosis not present

## 2019-12-21 DIAGNOSIS — M79641 Pain in right hand: Secondary | ICD-10-CM | POA: Diagnosis present

## 2019-12-21 DIAGNOSIS — E1140 Type 2 diabetes mellitus with diabetic neuropathy, unspecified: Secondary | ICD-10-CM | POA: Diagnosis not present

## 2019-12-21 MED ORDER — HYDROCODONE-ACETAMINOPHEN 5-325 MG PO TABS: 1.0000 | ORAL_TABLET | Freq: Four times a day (QID) | ORAL | 0 refills | Status: DC | PRN

## 2019-12-21 NOTE — ED Provider Notes (Signed)
[facility] Provider Note   CSN: [id number] Arrival date & time: 12/21/19  1107     History Chief Complaint  Patient presents with  . Hand Pain    Paula Jennings is a 54 y.o. female presents to the emergency department for evaluation of pain mostly in her right hand she describes it as burning numbness and tingling in the thumb and index finger but she also has some pain up along the triceps and around the right scapula.  She denies any neck pain trauma or injury.  No fevers.  Her symptoms have been present for greater than 1 year, she has a carpal tunnel brace and tries to wear this at nighttime but has not been wearing it she should.  She works at [employer] and states with using her hands her symptoms will increase.  She denies any neck pain.  No reproducible pain numbness or tingling throughout the right upper extremity with neck range of motion.  She is on gabapentin 300 mg 3 times a day.  She is currently taking ibuprofen.  She is diabetic and is working to get her A1c down.  She denies any weakness at this time.  Patient is scheduled to see [facility] tomorrow.  HPI     Past Medical History:  Diagnosis Date  . Diabetes mellitus without complication (HCC)   . Neuropathy   . Spinal stenosis     Patient Active Problem List   Diagnosis Date Noted  . Uncontrolled diabetes mellitus with diabetic neuropathy, with long-term current use of insulin (HCC) 11/25/2018  . Tobacco abuse 11/25/2018  . Hypertension 11/25/2018    Past Surgical History:  Procedure Laterality Date  . EYE SURGERY    . KIDNEY STONE SURGERY    . TUBAL LIGATION       OB History    Gravida  4   Para      Term      Preterm      AB      Living  4     SAB      TAB      Ectopic      Multiple      Live Births              Family History  Problem Relation Age of Onset  . Diabetes Mother   . Heart disease Father   . Hypertension Father    . Hypertension Sister   . Diabetes Maternal Grandfather   . Heart disease Maternal Grandfather   . Diabetes Paternal Grandmother     Social History   Tobacco Use  . Smoking status: Current Every Day Smoker    Packs/day: 0.50    Types: Cigarettes  . Smokeless tobacco: Never Used  Vaping Use  . Vaping Use: Never used  Substance Use Topics  . Alcohol use: No  . Drug use: No    Home Medications Prior to Admission medications   Medication Sig Start Date End Date Taking? Authorizing Provider  atorvastatin (LIPITOR) 20 MG tablet Take 1 tablet (20 mg total) by mouth at bedtime. 11/22/18   Danelle Berry, PA-C  HYDROcodone-acetaminophen (NORCO) 5-325 MG tablet Take 1 tablet by mouth every 6 (six) hours as needed for moderate pain. 12/21/19   Evon Slack, PA-C  lisinopril (ZESTRIL) 5 MG tablet Take 1 tablet (5 mg total) by mouth daily. 11/22/18   Danelle Berry, PA-C  traMADol (ULTRAM) 50 MG tablet Take 1 tablet (50 mg  total) by mouth every 6 (six) hours as needed. 08/12/19 08/11/20  Evon Slack, PA-C    Allergies    Patient has no known allergies.  Review of Systems   Review of Systems  Constitutional: Negative for fever.  Respiratory: Negative for chest tightness and shortness of breath.   Cardiovascular: Negative for chest pain.  Musculoskeletal: Negative for back pain, gait problem, joint swelling, myalgias and neck pain.  Skin: Negative for rash and wound.  Neurological: Positive for numbness. Negative for dizziness, weakness and headaches.    Physical Exam Updated Vital Signs BP (!) 144/80 (BP Location: Right Arm)   Pulse 96   Temp 98.3 F (36.8 C) (Oral)   Resp 16   Ht 5\' 5"  (1.651 m)   Wt 69.4 kg   LMP 12/17/2019 (Exact Date)   SpO2 99%   BMI 25.46 kg/m   Physical Exam Constitutional:      Appearance: Normal appearance. She is well-developed.  HENT:     Head: Normocephalic and atraumatic.  Eyes:     Extraocular Movements: Extraocular movements intact.      Conjunctiva/sclera: Conjunctivae normal.     Pupils: Pupils are equal, round, and reactive to light.  Cardiovascular:     Rate and Rhythm: Normal rate.  Pulmonary:     Effort: Pulmonary effort is normal. No respiratory distress.  Abdominal:     General: Abdomen is flat. There is no distension.     Palpations: Abdomen is soft.     Tenderness: There is no abdominal tenderness. There is no guarding.  Musculoskeletal:     Cervical back: Normal range of motion and neck supple. No rigidity.     Comments: Cervical Spine: Examination of the cervical spine reveals no bony abnormality, no edema, and no ecchymosis.  There is no step-off.  The patient has full active and passive range of motion of the cervical spine with flexion, extension, and right and left bend with rotation.  There is no crepitus with range of motion exercises.  The patient is non-tender along the spinous process to palpation.  The patient has no paravertebral muscle spasm.  There is no parascapular discomfort.  The patient has a negative axial compression test.  The patient has a negative Spurling test.  The patient has a negative overhead arm test for thoracic outlet syndrome.    Right upper Extremity: Examination of the right shoulder and arm showed no bony abnormality or edema.  The patient has normal active and passive motion with abduction, flexion, internal rotation, and external rotation.  The patient has no tenderness with motion.  The patient has a negative Hawkins test and a negative impingement test.  The patient has a negative drop arm test.  The patient is non-tender along the deltoid muscle.  There is no subacromial space tenderness with no AC joint tenderness.  The patient has no instability of the shoulder with anterior-posterior motion.  There is a negative sulcus sign.  The rotator cuff muscle strength is 5/5 with supraspinatus, 5/5 with internal rotation, and 5/5 with external rotation.  There is no crepitus with  range of motion activities.  Positive Tinel's and Phalen's on the right wrist with reproduction of numbness and tingling in the median nerve distribution with no thenar atrophy.   Skin:    General: Skin is warm.     Findings: No rash.  Neurological:     General: No focal deficit present.     Mental Status: She is alert and oriented  to person, place, and time.     Cranial Nerves: No cranial nerve deficit.     Motor: No weakness.     Coordination: Coordination normal.  Psychiatric:        Behavior: Behavior normal.        Thought Content: Thought content normal.     ED Results / Procedures / Treatments   Labs (all labs ordered are listed, but only abnormal results are displayed) Labs Reviewed - No data to display  EKG None  Radiology No results found.  Procedures Procedures (including critical care time)  Medications Ordered in ED Medications - No data to display  ED Course  I have reviewed the triage vital signs and the nursing notes.  Pertinent labs & imaging results that were available during my care of the patient were reviewed by me and considered in my medical decision making (see chart for details).    MDM Rules/Calculators/A&P                          54 year old female with history of carpal tunnel syndrome with numbness and tingling the right hand.  She is also complaining of some intermittent discomfort on the scapular border into the tricep region of her right arm.  She has negative Spurling's test and no neck pain.  No weakness or neurological deficits.  Symptoms are exacerbated with Phalen's test of the right wrist.  She is educated on proper wear of her brace as she has not been wearing this properly only at nighttime but tends to come out of it throughout the night.  Recommend she wear this during the day as much as possible as well as at nighttime.  She will continue with gabapentin, ibuprofen.  Have given a prescription for Norco to take at bedtime if needed.   She is scheduled to see orthopedics tomorrow. Final Clinical Impression(s) / ED Diagnoses Final diagnoses:  Carpal tunnel syndrome, right    Rx / DC Orders ED Discharge Orders         Ordered    HYDROcodone-acetaminophen (NORCO) 5-325 MG tablet  Every 6 hours PRN        12/21/19 1128           Ronnette Juniper 12/21/19 1136    Chesley Noon, MD 12/22/19 1547

## 2019-12-21 NOTE — Discharge Instructions (Signed)
Please follow-up with [facility] tomorrow for your regular scheduled appointment.  Make sure you are wearing your wrist brace at all times except for showering and washing your hands.

## 2019-12-21 NOTE — ED Triage Notes (Signed)
Pt c/o right hand pain 

## 2020-01-29 ENCOUNTER — Other Ambulatory Visit: Payer: Self-pay | Admitting: Specialist

## 2020-01-29 NOTE — H&P (Signed)
PREOPERATIVE H&P  Chief Complaint: G56.01 Carpal tunnel syndrome, right upper limb  HPI: Paula Jennings is a 54 y.o. female who presents for preoperative history and physical with a diagnosis of G56.01 Carpal tunnel syndrome, right upper limb. Symptoms are rated as moderate to severe, and have been worsening.  This is significantly impairing activities of daily living.  She has elected for surgical management.   Past Medical History:  Diagnosis Date  . Diabetes mellitus without complication (HCC)   . Neuropathy   . Spinal stenosis    Past Surgical History:  Procedure Laterality Date  . EYE SURGERY    . KIDNEY STONE SURGERY    . TUBAL LIGATION     Social History   Socioeconomic History  . Marital status: Married    Spouse name: Rusty  . Number of children: 4  . Years of education: 21  . Highest education level: Associate degree: occupational, Scientist, product/process development, or vocational program  Occupational History  . Not on file  Tobacco Use  . Smoking status: Current Every Day Smoker    Packs/day: 0.50    Types: Cigarettes  . Smokeless tobacco: Never Used  Vaping Use  . Vaping Use: Never used  Substance and Sexual Activity  . Alcohol use: No  . Drug use: No  . Sexual activity: Yes  Other Topics Concern  . Not on file  Social History Narrative  . Not on file   Social Determinants of Health   Financial Resource Strain: Not on file  Food Insecurity: Not on file  Transportation Needs: Not on file  Physical Activity: Not on file  Stress: Not on file  Social Connections: Not on file   Family History  Problem Relation Age of Onset  . Diabetes Mother   . Heart disease Father   . Hypertension Father   . Hypertension Sister   . Diabetes Maternal Grandfather   . Heart disease Maternal Grandfather   . Diabetes Paternal Grandmother    No Known Allergies Prior to Admission medications   Medication Sig Start Date End Date Taking? Authorizing Provider  atorvastatin (LIPITOR) 20 MG  tablet Take 1 tablet (20 mg total) by mouth at bedtime. 11/22/18  Yes Danelle Berry, PA-C  gabapentin (NEURONTIN) 300 MG capsule Take 300 mg by mouth 3 (three) times daily. 12/30/19  Yes [provider]  insulin detemir (LEVEMIR) 100 UNIT/ML injection Inject 30 Units into the skin every morning.   Yes [provider]  liraglutide (VICTOZA) 18 MG/3ML SOPN Inject 1.2 mg into the skin every morning.   Yes [provider]  lisinopril (ZESTRIL) 5 MG tablet Take 1 tablet (5 mg total) by mouth daily. 11/22/18  Yes Danelle Berry, PA-C  HYDROcodone-acetaminophen (NORCO) 5-325 MG tablet Take 1 tablet by mouth every 6 (six) hours as needed for moderate pain. Patient not taking: Reported on 01/22/2020 12/21/19   Evon Slack, PA-C     Positive ROS: All other systems have been reviewed and were otherwise negative with the exception of those mentioned in the HPI and as above.  Physical Exam: General: Alert, no acute distress Cardiovascular: No pedal edema. Heart is regular and without murmur.  Respiratory: No cyanosis, no use of accessory musculature. Lungs are clear. GI: No organomegaly, abdomen is soft and non-tender Skin: No lesions in the area of chief complaint Neurologic: Sensation intact distally Psychiatric: Patient is competent for consent with normal mood and affect Lymphatic: No axillary or cervical lymphadenopathy  MUSCULOSKELETAL: Right hand shows positive median nerve  compression Test and Phalen's. Gross Sensation Intact. Pinch Is Somewhat Weak. Thenar Muscles Have Mild Atrophy.  Assessment: G56.01 Carpal tunnel syndrome, right upper limb  Plan: Plan for Procedure(s): CARPAL TUNNEL RELEASE  The risks benefits and alternatives were discussed with the patient including but not limited to the risks of nonoperative treatment, versus surgical intervention including infection, bleeding, nerve injury,  blood clots, cardiopulmonary complications, morbidity,  mortality, among others, and they were willing to proceed.   Valinda Hoar, MD [phone]   01/29/2020 11:07 AM

## 2020-02-02 ENCOUNTER — Other Ambulatory Visit
Admission: RE | Admit: 2020-02-02 | Discharge: 2020-02-02 | Disposition: A | Discharge status: Home / Self Care | Payer: 59 | Source: Ambulatory Visit | Attending: Specialist | Admitting: Specialist

## 2020-02-02 ENCOUNTER — Other Ambulatory Visit: Payer: Self-pay

## 2020-02-02 DIAGNOSIS — Z01818 Encounter for other preprocedural examination: Secondary | ICD-10-CM | POA: Diagnosis not present

## 2020-02-02 HISTORY — DX: Family history of other specified conditions: Z84.89

## 2020-02-02 HISTORY — DX: Personal history of urinary calculi: Z87.442

## 2020-02-02 NOTE — Patient Instructions (Addendum)
Your procedure is scheduled on:02-12-20 THURSDAY Report to the Registration Desk on the 1st floor of the Medical Mall-Then proceed to the 2nd floor Surgery Desk in the Medical Mall To find out your arrival time, please call [phone] between 1PM - 3PM on: 02-11-20 [facility]  REMEMBER: Instructions that are not followed completely may result in serious medical risk, up to and including death; or upon the discretion of your surgeon and anesthesiologist your surgery may need to be rescheduled.  Do not eat food after midnight the night before surgery.  No gum chewing, lozengers or hard candies.  You may however, drink WATER up to 2 hours before you are scheduled to arrive for your surgery. Do not drink anything within 2 hours of your scheduled arrival time.  Type 1 and Type 2 diabetics should only drink water.  TAKE THESE MEDICATIONS THE MORNING OF SURGERY WITH A SIP OF WATER: -GABAPENTIN (NEURONTIN)    DO NOT TAKE ANY INSULIN THE MORNING OF YOUR SURGERY  One week prior to surgery: Stop Anti-inflammatories (NSAIDS) such as MELOXICAM (MOBIC) Advil, Aleve, Ibuprofen, Motrin, Naproxen, Naprosyn and Aspirin based products such as Excedrin, Goodys Powder, BC Powder-OK TO TAKE TYLENOL IF NEEDED  Stop ANY OVER THE COUNTER supplements until after surgery.  No Alcohol for 24 hours before or after surgery.  No Smoking including e-cigarettes for 24 hours prior to surgery.  No chewable tobacco products for at least 6 hours prior to surgery.  No nicotine patches on the day of surgery.  Do not use any "recreational" drugs for at least a week prior to your surgery.  Please be advised that the combination of cocaine and anesthesia may have negative outcomes, up to and including death. If you test positive for cocaine, your surgery will be cancelled.  On the morning of surgery brush your teeth with toothpaste and water, you may rinse your mouth with mouthwash if you wish. Do not swallow any  toothpaste or mouthwash.  Do not wear jewelry, make-up, hairpins, clips or nail polish.  Do not wear lotions, powders, or perfumes.   Do not shave body from the neck down 48 hours prior to surgery just in case you cut yourself which could leave a site for infection.  Also, freshly shaved skin may become irritated if using the CHG soap.  Contact lenses, hearing aids and dentures may not be worn into surgery.  Do not bring valuables to the hospital. [facility] is not responsible for any missing/lost belongings or valuables.   Use CHG Soap as directed on instruction sheet.  Notify your doctor if there is any change in your medical condition (cold, fever, infection).  Wear comfortable clothing (specific to your surgery type) to the hospital.  Plan for stool softeners for home use; pain medications have a tendency to cause constipation. You can also help prevent constipation by eating foods high in fiber such as fruits and vegetables and drinking plenty of fluids as your diet allows.  After surgery, you can help prevent lung complications by doing breathing exercises.  Take deep breaths and cough every 1-2 hours. Your doctor may order a device called an Incentive Spirometer to help you take deep breaths. When coughing or sneezing, hold a pillow firmly against your incision with both hands. This is called "splinting." Doing this helps protect your incision. It also decreases belly discomfort.  If you are being admitted to the hospital overnight, leave your suitcase in the car. After surgery it may be brought to your  room.  If you are being discharged the day of surgery, you will not be allowed to drive home. You will need a responsible adult (18 years or older) to drive you home and stay with you that night.   If you are taking public transportation, you will need to have a responsible adult (18 years or older) with you. Please confirm with your physician that it is acceptable to use  public transportation.   Please call the Pre-admissions Testing Dept. at [phone] if you have any questions about these instructions.  Visitation Policy:  Patients undergoing a surgery or procedure may have one family member or support person with them as long as that person is not COVID-19 positive or experiencing its symptoms.  That person may remain in the waiting area during the procedure.  Inpatient Visitation:    Visiting hours are 7 a.m. to 8 p.m. Patients will be allowed one visitor. The visitor may change daily. The visitor must pass COVID-19 screenings, use hand sanitizer when entering and exiting the patient's room and wear a mask at all times, including in the patient's room. Patients must also wear a mask when staff or their visitor are in the room. Masking is required regardless of vaccination status. Systemwide, no visitors 17 or younger.

## 2020-02-03 ENCOUNTER — Ambulatory Visit
Admission: RE | Admit: 2020-02-03 | Discharge: 2020-02-03 | Disposition: A | Discharge status: Home / Self Care | Payer: 59 | Source: Ambulatory Visit | Attending: Sports Medicine | Admitting: Sports Medicine

## 2020-02-10 ENCOUNTER — Other Ambulatory Visit
Admission: RE | Admit: 2020-02-10 | Discharge: 2020-02-10 | Disposition: A | Discharge status: Home / Self Care | Payer: 59 | Source: Ambulatory Visit | Attending: Specialist | Admitting: Specialist

## 2020-02-10 ENCOUNTER — Other Ambulatory Visit: Payer: Self-pay

## 2020-02-10 DIAGNOSIS — Z20822 Contact with and (suspected) exposure to covid-19: Secondary | ICD-10-CM | POA: Insufficient documentation

## 2020-02-10 DIAGNOSIS — Z01812 Encounter for preprocedural laboratory examination: Secondary | ICD-10-CM | POA: Diagnosis not present

## 2020-02-10 LAB — SARS CORONAVIRUS 2 (TAT 6-24 HRS): SARS Coronavirus 2: NEGATIVE

## 2020-02-11 ENCOUNTER — Ambulatory Visit: Payer: 59 | Admitting: Anesthesiology

## 2020-02-11 ENCOUNTER — Encounter: Payer: Self-pay | Admitting: Specialist

## 2020-02-11 ENCOUNTER — Encounter
Admission: RE | Disposition: A | Discharge status: Home / Self Care | Payer: Self-pay | Source: Home / Self Care | Attending: Specialist

## 2020-02-11 ENCOUNTER — Other Ambulatory Visit: Payer: Self-pay

## 2020-02-11 ENCOUNTER — Ambulatory Visit
Admission: RE | Admit: 2020-02-11 | Discharge: 2020-02-11 | Disposition: A | Discharge status: Home / Self Care | Payer: 59 | Attending: Specialist | Admitting: Specialist

## 2020-02-11 DIAGNOSIS — Z833 Family history of diabetes mellitus: Secondary | ICD-10-CM | POA: Insufficient documentation

## 2020-02-11 DIAGNOSIS — E119 Type 2 diabetes mellitus without complications: Secondary | ICD-10-CM | POA: Insufficient documentation

## 2020-02-11 DIAGNOSIS — G5601 Carpal tunnel syndrome, right upper limb: Secondary | ICD-10-CM | POA: Insufficient documentation

## 2020-02-11 DIAGNOSIS — Z79899 Other long term (current) drug therapy: Secondary | ICD-10-CM | POA: Insufficient documentation

## 2020-02-11 DIAGNOSIS — Z794 Long term (current) use of insulin: Secondary | ICD-10-CM | POA: Insufficient documentation

## 2020-02-11 DIAGNOSIS — Z8249 Family history of ischemic heart disease and other diseases of the circulatory system: Secondary | ICD-10-CM | POA: Insufficient documentation

## 2020-02-11 HISTORY — PX: CARPAL TUNNEL RELEASE: SHX101

## 2020-02-11 LAB — GLUCOSE, CAPILLARY
Glucose-Capillary: 117 mg/dL — ABNORMAL HIGH (ref 70–99)
Glucose-Capillary: 108 mg/dL — ABNORMAL HIGH (ref 70–99)

## 2020-02-11 LAB — POCT PREGNANCY, URINE: Preg Test, Ur: NEGATIVE

## 2020-02-11 SURGERY — CARPAL TUNNEL RELEASE
Anesthesia: General | Site: Hand | Laterality: Right

## 2020-02-11 MED ORDER — MIDAZOLAM HCL 2 MG/2ML IJ SOLN
INTRAMUSCULAR | Status: DC | PRN
  Administered 2020-02-11: 2 mg via INTRAVENOUS

## 2020-02-11 MED ORDER — SUCCINYLCHOLINE CHLORIDE 20 MG/ML IJ SOLN
INTRAMUSCULAR | Status: DC | PRN
  Administered 2020-02-11: 100 mg via INTRAVENOUS

## 2020-02-11 MED ORDER — LIDOCAINE HCL (CARDIAC) PF 100 MG/5ML IV SOSY
PREFILLED_SYRINGE | INTRAVENOUS | Status: DC | PRN
  Administered 2020-02-11: 80 mg via INTRAVENOUS

## 2020-02-11 MED ORDER — FAMOTIDINE 20 MG PO TABS
ORAL_TABLET | ORAL | Status: AC
  Administered 2020-02-11: 20 mg via ORAL
  Filled 2020-02-11: qty 1

## 2020-02-11 MED ORDER — CHLORHEXIDINE GLUCONATE 0.12 % MT SOLN: 15.0000 mL | Freq: Once | OROMUCOSAL | Status: AC

## 2020-02-11 MED ORDER — FENTANYL CITRATE (PF) 250 MCG/5ML IJ SOLN
INTRAMUSCULAR | Status: AC
  Filled 2020-02-11: qty 5

## 2020-02-11 MED ORDER — ACETAMINOPHEN 10 MG/ML IV SOLN
INTRAVENOUS | Status: DC | PRN
  Administered 2020-02-11: 1000 mg via INTRAVENOUS

## 2020-02-11 MED ORDER — GABAPENTIN 300 MG PO CAPS: 300.0000 mg | ORAL_CAPSULE | ORAL | Status: DC

## 2020-02-11 MED ORDER — FENTANYL CITRATE (PF) 100 MCG/2ML IJ SOLN
INTRAMUSCULAR | Status: DC | PRN
  Administered 2020-02-11 (×2): 50 ug via INTRAVENOUS

## 2020-02-11 MED ORDER — ROCURONIUM BROMIDE 100 MG/10ML IV SOLN
INTRAVENOUS | Status: DC | PRN
  Administered 2020-02-11: 10 mg via INTRAVENOUS

## 2020-02-11 MED ORDER — ORAL CARE MOUTH RINSE: 15.0000 mL | Freq: Once | OROMUCOSAL | Status: AC

## 2020-02-11 MED ORDER — MELOXICAM 7.5 MG PO TABS
ORAL_TABLET | ORAL | Status: AC
  Administered 2020-02-11: 15 mg via ORAL
  Filled 2020-02-11: qty 2

## 2020-02-11 MED ORDER — ONDANSETRON HCL 4 MG/2ML IJ SOLN
INTRAMUSCULAR | Status: AC
  Filled 2020-02-11: qty 2

## 2020-02-11 MED ORDER — CLINDAMYCIN PHOSPHATE 600 MG/50ML IV SOLN
600.0000 mg | INTRAVENOUS | Status: AC
  Administered 2020-02-11: 600 mg via INTRAVENOUS

## 2020-02-11 MED ORDER — FENTANYL CITRATE (PF) 100 MCG/2ML IJ SOLN: 25.0000 ug | INTRAMUSCULAR | Status: DC | PRN

## 2020-02-11 MED ORDER — CEFAZOLIN SODIUM-DEXTROSE 2-4 GM/100ML-% IV SOLN
INTRAVENOUS | Status: AC
  Filled 2020-02-11: qty 100

## 2020-02-11 MED ORDER — LACTATED RINGERS IV SOLN: INTRAVENOUS | Status: DC | PRN

## 2020-02-11 MED ORDER — MELOXICAM 15 MG PO TABS: 15.0000 mg | ORAL_TABLET | Freq: Every day | ORAL | 3 refills | Status: AC

## 2020-02-11 MED ORDER — DEXAMETHASONE SODIUM PHOSPHATE 10 MG/ML IJ SOLN
INTRAMUSCULAR | Status: DC | PRN
  Administered 2020-02-11: 5 mg via INTRAVENOUS

## 2020-02-11 MED ORDER — FAMOTIDINE 20 MG PO TABS: 20.0000 mg | ORAL_TABLET | Freq: Once | ORAL | Status: AC

## 2020-02-11 MED ORDER — PROPOFOL 10 MG/ML IV BOLUS
INTRAVENOUS | Status: AC
  Filled 2020-02-11: qty 20

## 2020-02-11 MED ORDER — ACETAMINOPHEN 10 MG/ML IV SOLN
INTRAVENOUS | Status: AC
  Filled 2020-02-11: qty 100

## 2020-02-11 MED ORDER — CHLORHEXIDINE GLUCONATE 0.12 % MT SOLN
OROMUCOSAL | Status: AC
  Administered 2020-02-11: 15 mL via OROMUCOSAL
  Filled 2020-02-11: qty 15

## 2020-02-11 MED ORDER — MELOXICAM 7.5 MG PO TABS: 15.0000 mg | ORAL_TABLET | ORAL | Status: AC

## 2020-02-11 MED ORDER — SODIUM CHLORIDE 0.9 % IV SOLN: INTRAVENOUS | Status: DC

## 2020-02-11 MED ORDER — CEFAZOLIN SODIUM-DEXTROSE 2-4 GM/100ML-% IV SOLN
2.0000 g | INTRAVENOUS | Status: AC
  Administered 2020-02-11: 2 g via INTRAVENOUS

## 2020-02-11 MED ORDER — DEXAMETHASONE SODIUM PHOSPHATE 10 MG/ML IJ SOLN
INTRAMUSCULAR | Status: AC
  Filled 2020-02-11: qty 1

## 2020-02-11 MED ORDER — CHLORHEXIDINE GLUCONATE CLOTH 2 % EX PADS: 6.0000 | MEDICATED_PAD | Freq: Once | CUTANEOUS | Status: DC

## 2020-02-11 MED ORDER — BUPIVACAINE HCL 0.5 % IJ SOLN
INTRAMUSCULAR | Status: DC | PRN
  Administered 2020-02-11: 17 mL

## 2020-02-11 MED ORDER — ONDANSETRON HCL 4 MG/2ML IJ SOLN
INTRAMUSCULAR | Status: DC | PRN
  Administered 2020-02-11: 4 mg via INTRAVENOUS

## 2020-02-11 MED ORDER — HYDROCODONE-ACETAMINOPHEN 5-325 MG PO TABS: 1.0000 | ORAL_TABLET | Freq: Four times a day (QID) | ORAL | 0 refills | Status: DC | PRN

## 2020-02-11 MED ORDER — MIDAZOLAM HCL 2 MG/2ML IJ SOLN
INTRAMUSCULAR | Status: AC
  Filled 2020-02-11: qty 2

## 2020-02-11 MED ORDER — CLINDAMYCIN PHOSPHATE 600 MG/50ML IV SOLN
INTRAVENOUS | Status: AC
  Filled 2020-02-11: qty 50

## 2020-02-11 MED ORDER — LIDOCAINE HCL (PF) 2 % IJ SOLN
INTRAMUSCULAR | Status: AC
  Filled 2020-02-11: qty 5

## 2020-02-11 MED ORDER — PROPOFOL 10 MG/ML IV BOLUS
INTRAVENOUS | Status: DC | PRN
  Administered 2020-02-11: 150 mg via INTRAVENOUS
  Administered 2020-02-11: 30 mg via INTRAVENOUS

## 2020-02-11 MED ORDER — ONDANSETRON HCL 4 MG/2ML IJ SOLN: 4.0000 mg | Freq: Once | INTRAMUSCULAR | Status: DC | PRN

## 2020-02-11 SURGICAL SUPPLY — 30 items
APL PRP STRL LF DISP 70% ISPRP (MISCELLANEOUS) ×1
BLADE SURG MINI STRL (BLADE) ×2 IMPLANT
BNDG ESMARK 4X12 TAN STRL LF (GAUZE/BANDAGES/DRESSINGS) ×2 IMPLANT
CHLORAPREP W/TINT 26 (MISCELLANEOUS) ×2 IMPLANT
COVER WAND RF STERILE (DRAPES) ×2 IMPLANT
CUFF TOURN SGL QUICK 18X4 (TOURNIQUET CUFF) ×1 IMPLANT
DRSG GAUZE FLUFF 36X18 (GAUZE/BANDAGES/DRESSINGS) ×4 IMPLANT
ELECT REM PT RETURN 9FT ADLT (ELECTROSURGICAL)
ELECTRODE REM PT RTRN 9FT ADLT (ELECTROSURGICAL) ×1 IMPLANT
GAUZE XEROFORM 1X8 LF (GAUZE/BANDAGES/DRESSINGS) ×2 IMPLANT
GLOVE BIO SURGEON STRL SZ8 (GLOVE) ×2 IMPLANT
GOWN STRL REUS W/ TWL LRG LVL3 (GOWN DISPOSABLE) ×1 IMPLANT
GOWN STRL REUS W/TWL LRG LVL3 (GOWN DISPOSABLE) ×2
GOWN STRL REUS W/TWL LRG LVL4 (GOWN DISPOSABLE) ×2 IMPLANT
KIT TURNOVER KIT A (KITS) ×2 IMPLANT
MANIFOLD NEPTUNE II (INSTRUMENTS) ×2 IMPLANT
NS IRRIG 500ML POUR BTL (IV SOLUTION) ×2 IMPLANT
PACK EXTREMITY ARMC (MISCELLANEOUS) ×2 IMPLANT
PAD PREP 24X41 OB/GYN DISP (PERSONAL CARE ITEMS) ×2 IMPLANT
PADDING CAST 4IN STRL (MISCELLANEOUS) ×1
PADDING CAST BLEND 4X4 STRL (MISCELLANEOUS) ×1 IMPLANT
SPLINT CAST 1 STEP 3X12 (MISCELLANEOUS) ×2 IMPLANT
STOCKINETTE 48X4 2 PLY STRL (GAUZE/BANDAGES/DRESSINGS) ×1 IMPLANT
STOCKINETTE BIAS CUT 4 980044 (GAUZE/BANDAGES/DRESSINGS) ×2 IMPLANT
STOCKINETTE STRL 4IN 9604848 (GAUZE/BANDAGES/DRESSINGS) ×2 IMPLANT
STOCKINETTE STRL 6IN 960660 (GAUZE/BANDAGES/DRESSINGS) ×2 IMPLANT
SUT ETHILON 4-0 (SUTURE) ×2
SUT ETHILON 4-0 FS2 18XMFL BLK (SUTURE) ×1
SUT ETHILON 5-0 FS-2 18 BLK (SUTURE) ×2 IMPLANT
SUTURE ETHLN 4-0 FS2 18XMF BLK (SUTURE) ×1 IMPLANT

## 2020-02-11 NOTE — Anesthesia Preprocedure Evaluation (Addendum)
Anesthesia Evaluation  Patient identified by MRN, date of birth, ID band Patient awake    Reviewed: Allergy & Precautions, NPO status , Patient's Chart, lab work & pertinent test results  Airway Mallampati: II  TM Distance: >3 FB     Dental  (+) Loose, Poor Dentition Very poor dentition with loose teeth top and bottom and extensive decayed roots showing:   Pulmonary Current Smoker,    Pulmonary exam normal        Cardiovascular hypertension, Normal cardiovascular exam     Neuro/Psych negative neurological ROS  negative psych ROS   GI/Hepatic negative GI ROS, Neg liver ROS,   Endo/Other  diabetes  Renal/GU negative Renal ROS  negative genitourinary   Musculoskeletal   Abdominal Normal abdominal exam  (+)   Peds negative pediatric ROS (+)  Hematology negative hematology ROS (+)   Anesthesia Other Findings Past Medical History: No date: Diabetes mellitus without complication (HCC) No date: Family history of adverse reaction to anesthesia     Comment:  father-hard time waking up No date: History of kidney stones     Comment:  h/o No date: Neuropathy No date: Spinal stenosis  Reproductive/Obstetrics                            Anesthesia Physical Anesthesia Plan  ASA: II  Anesthesia Plan: General   Post-op Pain Management:    Induction: Intravenous  PONV Risk Score and Plan:   Airway Management Planned: LMA and Oral ETT  Additional Equipment:   Intra-op Plan:   Post-operative Plan: Extubation in OR  Informed Consent: I have reviewed the patients History and Physical, chart, labs and discussed the procedure including the risks, benefits and alternatives for the proposed anesthesia with the patient or authorized representative who has indicated his/her understanding and acceptance.     Dental advisory given  Plan Discussed with: CRNA and Surgeon  Anesthesia Plan Comments:         Anesthesia Quick Evaluation

## 2020-02-11 NOTE — H&P (Signed)
THE PATIENT WAS SEEN PRIOR TO SURGERY TODAY.  HISTORY, ALLERGIES, HOME MEDICATIONS AND OPERATIVE PROCEDURE WERE REVIEWED. RISKS AND BENEFITS OF SURGERY DISCUSSED WITH PATIENT AGAIN.  NO CHANGES FROM INITIAL HISTORY AND PHYSICAL NOTED.    

## 2020-02-11 NOTE — Op Note (Signed)
02/11/2020  3:03 PM  PATIENT:  Paula Jennings    PRE-OPERATIVE DIAGNOSIS: RIGHT CARPAL TUNNEL SYNDROME POST-OPERATIVE DIAGNOSIS: RIGHT CARPAL TUNNEL SYNDROME  PROCEDURE:  RIGHT CARPAL TUNNEL RELEASE  SURGEON: Valinda Hoar, MD   ANESTHESIA:   General  TOURNIQUET TIME: 17   MIN  PREOPERATIVE INDICATIONS:  Paula Jennings is a  55 y.o. female with a diagnosis of right carpal tunnel syndrome who failed conservative measures and elected for surgical management.    The risks benefits and alternatives were discussed with the patient preoperatively including but not limited to the risks of infection, bleeding, nerve injury, incomplete relief of symptoms, pillar pain, cardiopulmonary complications, the need for revision surgery, among others, and the patient was willing to proceed.  OPERATIVE FINDINGS: Thickened volar ligament and nerve compression.  OPERATIVE PROCEDURE: The patient is brought to the operating room placed in the supine position. General anesthesia was administered. The right upper extremity was prepped and draped in usual sterile fashion. Time out was performed. The arm was elevated and exsanguinated and the tourniquet was inflated. Incision was made in line with the radial border of the ring finger. The carpal tunnel transverse fascia was identified, cleaned, and incised sharply. The common sensory branches were visualized along with the superficial palmar arch and protected.  The median nerve was protected below  A Kelly clamp was  placed underneath the transverse carpal ligament, protecting the nerve. I released the ligament completely, and then released the proximal distal volar forearm fascia. The nerve was identified, and visualized, and protected throughout the case. The motor branch was intact upon inspection.  No masses or abnormalities were identified in ulnar bursa.  The wounds were irrigated copiously, and the skin closed with nylon. The wound was injected with 1/2%  marcaine followed by a sterile dressing and  volar splint .  Tourniquet was deflated with good return of blood flow to all fingers. Sponge and needle counts were correct.  The patient tolerated this well, with no complications. The patient was awakened and taken to recovery in good condition.

## 2020-02-11 NOTE — Anesthesia Procedure Notes (Signed)
Procedure Name: Intubation Date/Time: 02/11/2020 2:18 PM Performed by: Elmarie Mainland, CRNA Pre-anesthesia Checklist: Patient identified, Emergency Drugs available, Patient being monitored and Suction available Patient Re-evaluated:Patient Re-evaluated prior to induction Oxygen Delivery Method: Circle system utilized Preoxygenation: Pre-oxygenation with 100% oxygen Induction Type: IV induction Ventilation: Mask ventilation without difficulty Laryngoscope Size: McGraph and 3 Grade View: Grade I Tube size: 7.0 mm Number of attempts: 1 Airway Equipment and Method: Stylet and Video-laryngoscopy Placement Confirmation: ETT inserted through vocal cords under direct vision,  positive ETCO2 and breath sounds checked- equal and bilateral Secured at: 21 cm Tube secured with: Tape Dental Injury: Teeth and Oropharynx as per pre-operative assessment

## 2020-02-11 NOTE — Transfer of Care (Signed)
Immediate Anesthesia Transfer of Care Note  Patient: Paula Jennings  Procedure(s) Performed: CARPAL TUNNEL RELEASE (Right Hand)  Patient Location: PACU  Anesthesia Type:General  Level of Consciousness: awake, drowsy and patient cooperative  Airway & Oxygen Therapy: Patient Spontanous Breathing and Patient connected to face mask oxygen  Post-op Assessment: Report given to RN and Post -op Vital signs reviewed and stable  Post vital signs: Reviewed and stable  Last Vitals:  Vitals Value Taken Time  BP 120/71 1512 02/11/2020  Temp    Pulse 85   Resp 13   SpO2 100     Last Pain:  Vitals:   02/11/20 1257  TempSrc: Temporal  PainSc: 2          Complications: No complications documented.

## 2020-02-12 ENCOUNTER — Encounter: Payer: Self-pay | Admitting: Specialist

## 2020-02-14 NOTE — Anesthesia Postprocedure Evaluation (Signed)
Anesthesia Post Note  Patient: Paula Jennings  Procedure(s) Performed: CARPAL TUNNEL RELEASE (Right Hand)  Patient location during evaluation: PACU Anesthesia Type: General Level of consciousness: awake and alert and oriented Pain management: pain level controlled Vital Signs Assessment: post-procedure vital signs reviewed and stable Respiratory status: spontaneous breathing Cardiovascular status: blood pressure returned to baseline Anesthetic complications: no   No complications documented.   Last Vitals:  Vitals:   02/11/20 1527 02/11/20 1541  BP: 129/77 128/69  Pulse: 88 84  Resp: 15 11  Temp:  36.6 C  SpO2: 100% 100%    Last Pain:  Vitals:   02/12/20 1321  TempSrc:   PainSc: 2                  Samarra Ridgely

## 2020-04-21 ENCOUNTER — Other Ambulatory Visit: Payer: Self-pay | Admitting: Family Medicine

## 2020-04-21 DIAGNOSIS — Z1231 Encounter for screening mammogram for malignant neoplasm of breast: Secondary | ICD-10-CM

## 2020-05-16 IMAGING — CT CT HEAD W/O CM
3 series · 16 of 45 positions shown, 19 images · non-contrast
Comparison: Head CT 12/30/2017.

CLINICAL DATA: 52-year-old female who woke at 6166 hours with right
hand and forearm numbness. Symptoms not improved.

EXAM:
CT HEAD WITHOUT CONTRAST
TECHNIQUE: Contiguous axial images were obtained from the base of the skull
through the vertex without intravenous contrast.

[Series 3: head wo · axial · 0.40mm/px · z∈[-103,+12]mm · 10 of 28 slices shown, 13 images]
[im 3/28  brain]
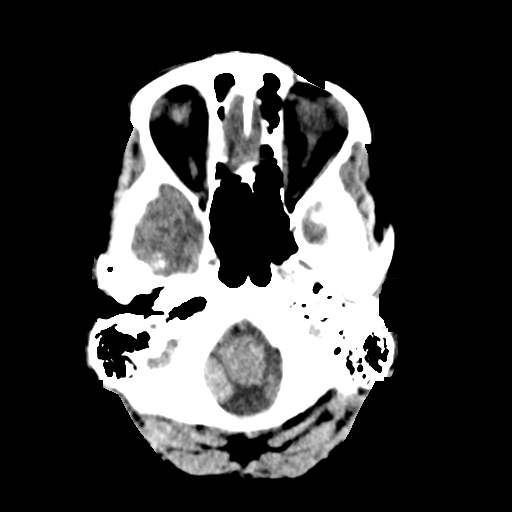
[im 3/28  bone]
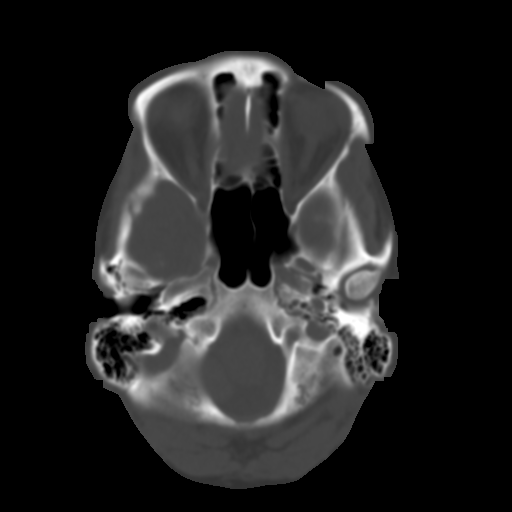
[im 5/28  brain]
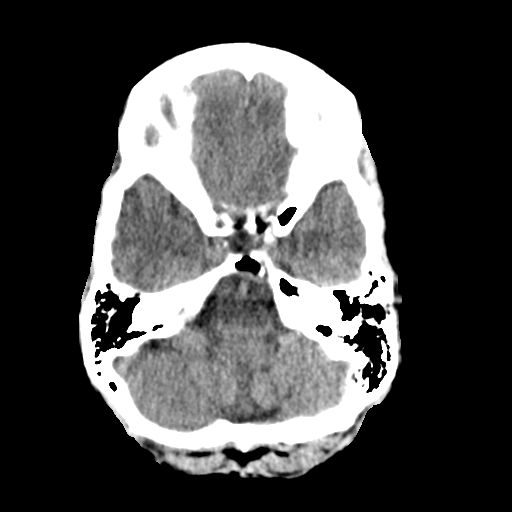
[im 8/28  brain]
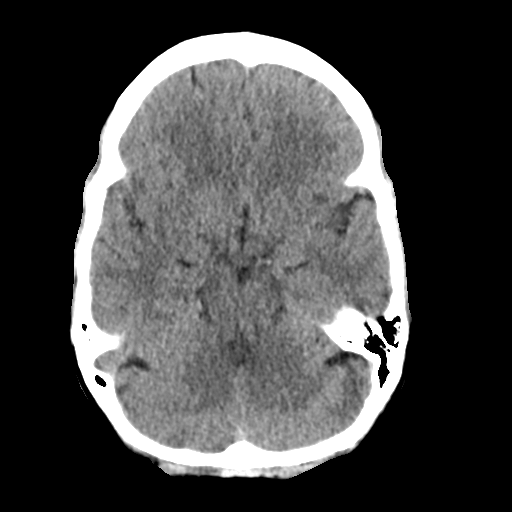
[im 11/28  brain]
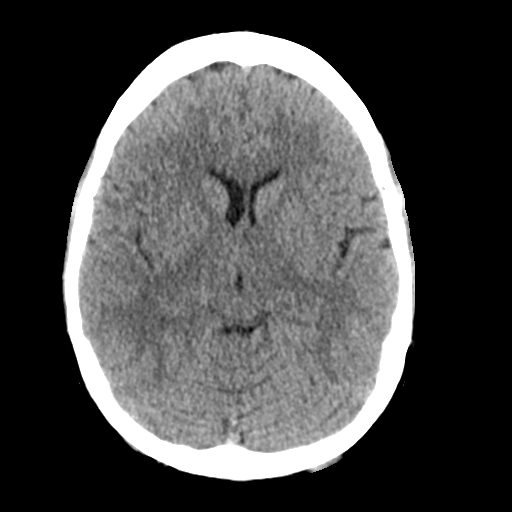
[im 13/28  brain]
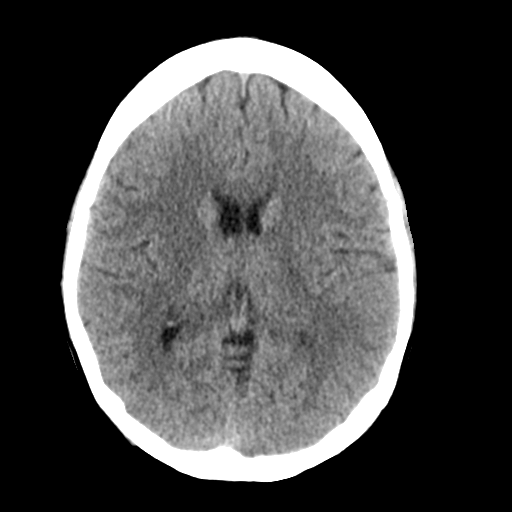
[im 13/28  bone]
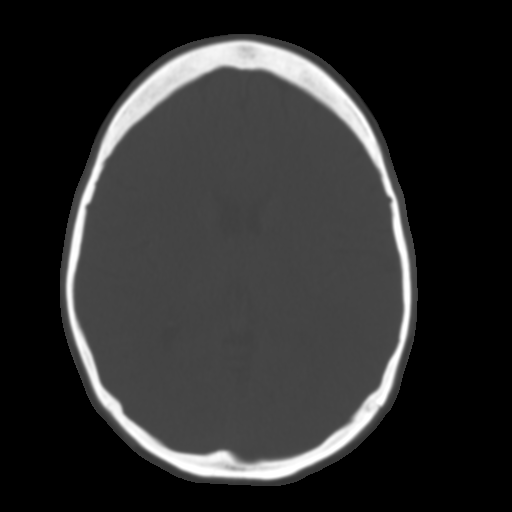
[im 16/28  brain]
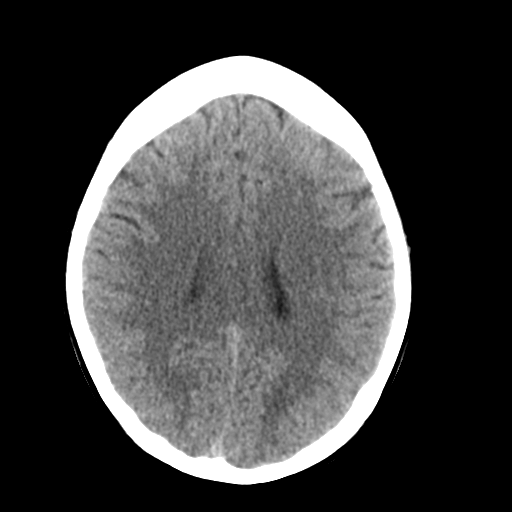
[im 18/28  brain]
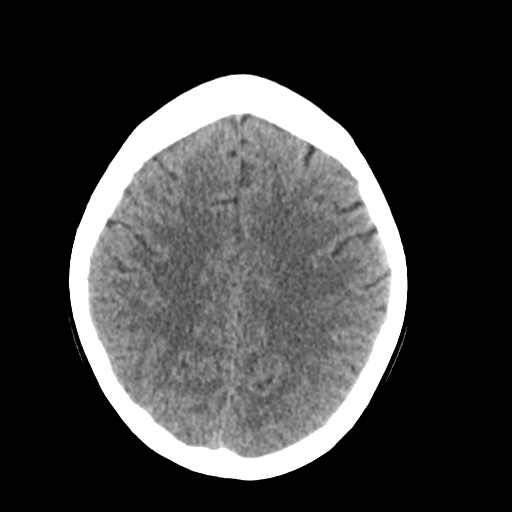
[im 21/28  brain]
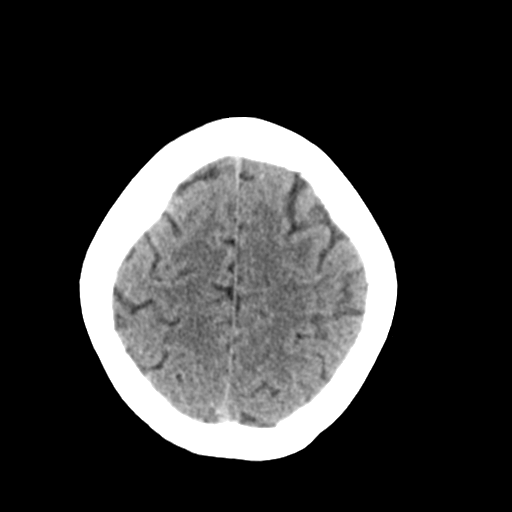
[im 24/28  brain]
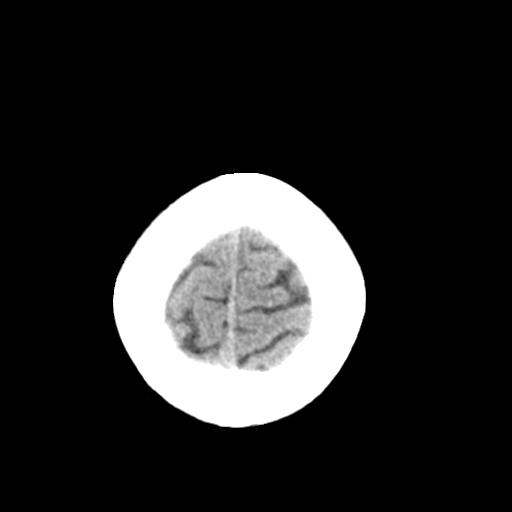
[im 24/28  bone]
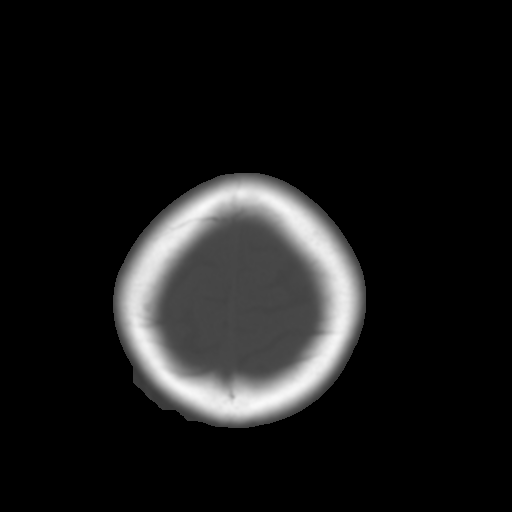
[im 26/28  brain]
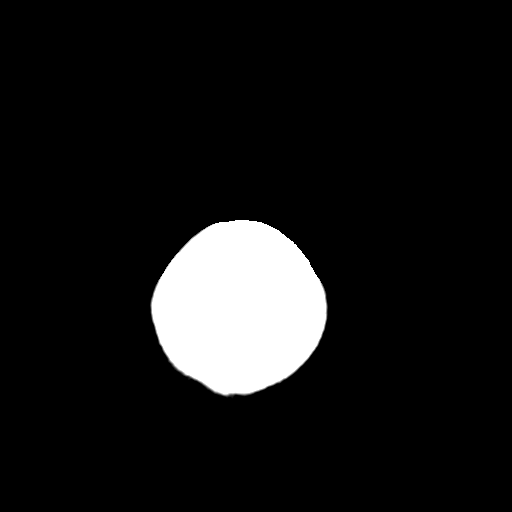

[Series 4: coronal soft tissue · coronal · 0.28mm/px · 3 of 61 slices shown]
[im 21/61  brain]
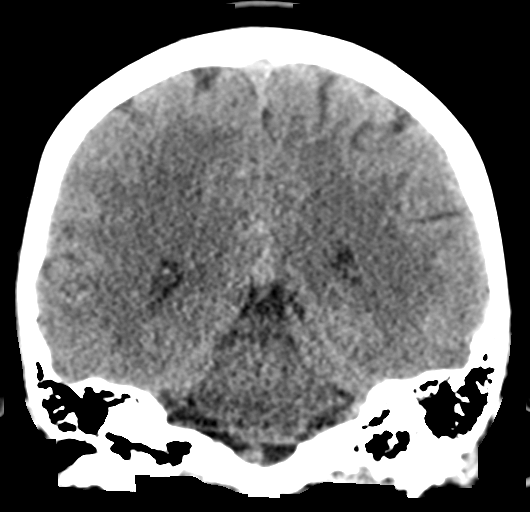
[im 27/61  brain]
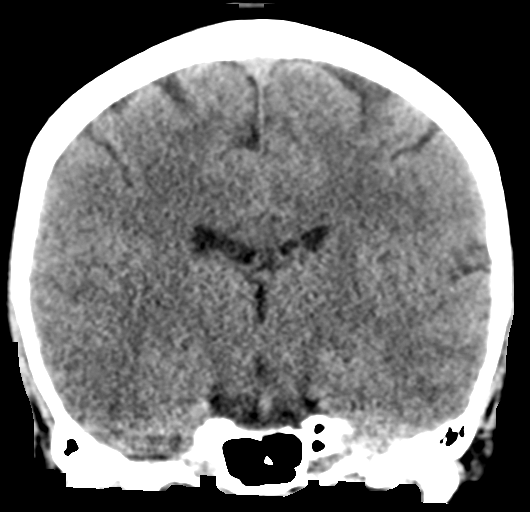
[im 34/61  brain]
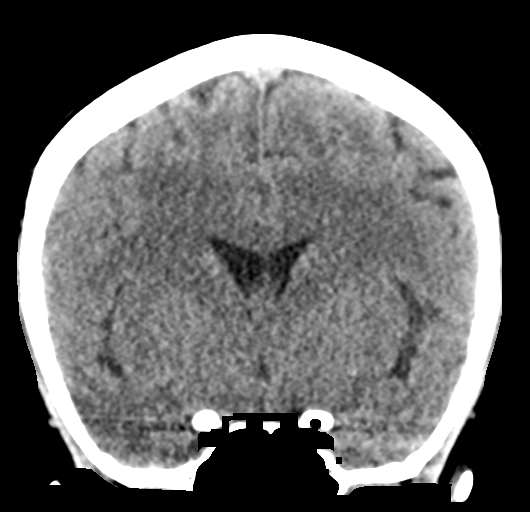

[Series 5: sagittal soft tissue · sagittal · 0.28mm/px · 3 of 50 slices shown]
[im 17/50  brain]
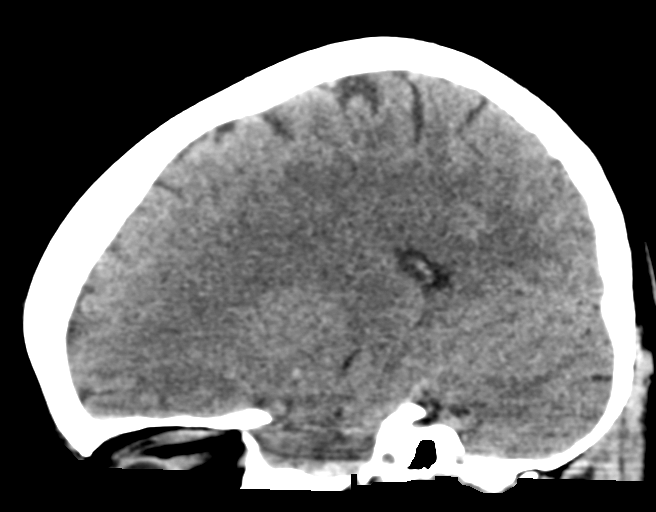
[im 25/50  brain]
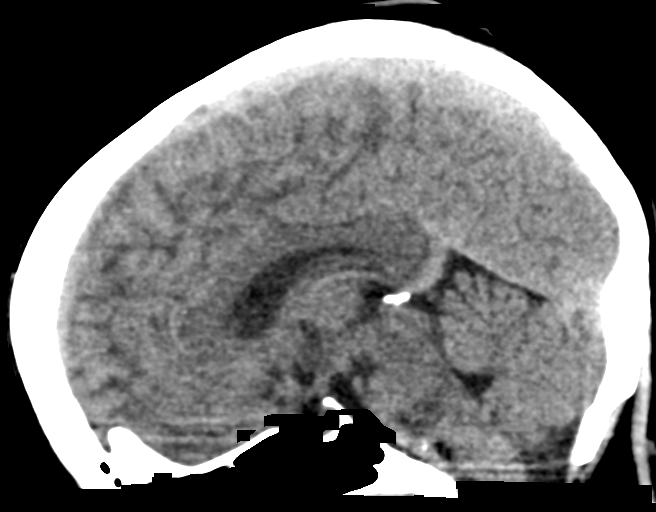
[im 33/50  brain]
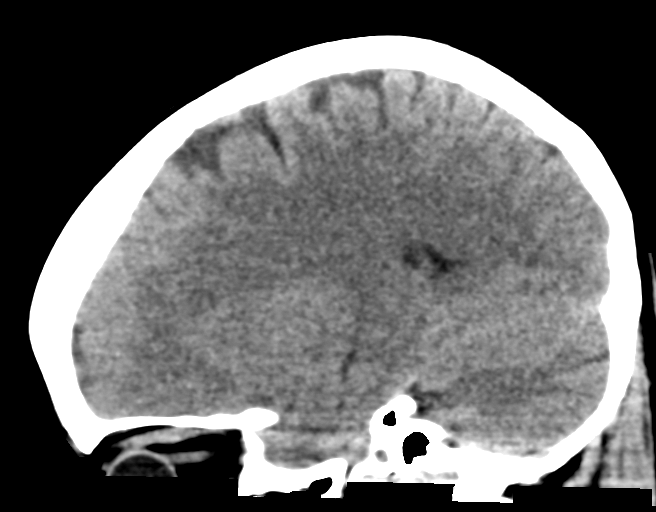

[16 of 45 positions shown; findings below may reference images not displayed]

FINDINGS: Brain: Normal cerebral volume. No midline shift, ventriculomegaly,
mass effect, evidence of mass lesion, intracranial hemorrhage or
evidence of cortically based acute infarction. Gray-white matter
differentiation is within normal limits throughout the brain.

Vascular: No suspicious intracranial vascular hyperdensity.

Skull: Negative.

Sinuses/Orbits: Visualized paranasal sinuses and mastoids are stable
and well pneumatized.

Other: Visualized orbits and scalp soft tissues are within normal
limits.
IMPRESSION: Stable and normal noncontrast CT appearance of the brain.

## 2020-05-19 ENCOUNTER — Other Ambulatory Visit: Payer: Self-pay

## 2020-05-19 ENCOUNTER — Inpatient Hospital Stay
Admission: RE | Admit: 2020-05-19 | Discharge: 2020-05-19 | Disposition: A | Discharge status: Home / Self Care | Payer: Self-pay | Source: Ambulatory Visit | Attending: *Deleted | Admitting: *Deleted

## 2020-05-19 ENCOUNTER — Other Ambulatory Visit: Payer: Self-pay | Admitting: *Deleted

## 2020-05-19 ENCOUNTER — Ambulatory Visit
Admission: RE | Admit: 2020-05-19 | Discharge: 2020-05-19 | Disposition: A | Discharge status: Home / Self Care | Payer: 59 | Source: Ambulatory Visit | Attending: Family Medicine | Admitting: Family Medicine

## 2020-05-19 DIAGNOSIS — Z1231 Encounter for screening mammogram for malignant neoplasm of breast: Secondary | ICD-10-CM | POA: Insufficient documentation

## 2020-06-01 ENCOUNTER — Other Ambulatory Visit: Payer: Self-pay | Admitting: Family Medicine

## 2020-06-01 DIAGNOSIS — N6489 Other specified disorders of breast: Secondary | ICD-10-CM

## 2020-06-01 DIAGNOSIS — R928 Other abnormal and inconclusive findings on diagnostic imaging of breast: Secondary | ICD-10-CM

## 2020-06-02 ENCOUNTER — Other Ambulatory Visit: Payer: Self-pay

## 2020-06-02 ENCOUNTER — Ambulatory Visit
Admission: RE | Admit: 2020-06-02 | Discharge: 2020-06-02 | Disposition: A | Discharge status: Home / Self Care | Payer: 59 | Source: Ambulatory Visit | Attending: Family Medicine | Admitting: Family Medicine

## 2020-06-02 DIAGNOSIS — R928 Other abnormal and inconclusive findings on diagnostic imaging of breast: Secondary | ICD-10-CM | POA: Insufficient documentation

## 2020-06-02 DIAGNOSIS — N6489 Other specified disorders of breast: Secondary | ICD-10-CM | POA: Diagnosis present

## 2020-08-23 ENCOUNTER — Telehealth: Payer: Self-pay | Admitting: Family Medicine

## 2020-08-23 NOTE — Telephone Encounter (Signed)
Heather from [facility] called to report that the patient has accidentally taken glipizide for 4 days. Herbert Seta states that she had to report this with PCP she had on file. Provided with Dr. Larrie Kass contact information.

## 2020-08-27 ENCOUNTER — Other Ambulatory Visit: Payer: Self-pay

## 2020-08-27 ENCOUNTER — Encounter: Payer: Self-pay | Admitting: Emergency Medicine

## 2020-08-27 ENCOUNTER — Ambulatory Visit
Admission: EM | Admit: 2020-08-27 | Discharge: 2020-08-27 | Disposition: A | Discharge status: Home / Self Care | Payer: 59 | Attending: Family Medicine | Admitting: Family Medicine

## 2020-08-27 DIAGNOSIS — F17210 Nicotine dependence, cigarettes, uncomplicated: Secondary | ICD-10-CM | POA: Insufficient documentation

## 2020-08-27 DIAGNOSIS — H9202 Otalgia, left ear: Secondary | ICD-10-CM | POA: Insufficient documentation

## 2020-08-27 DIAGNOSIS — J069 Acute upper respiratory infection, unspecified: Secondary | ICD-10-CM

## 2020-08-27 DIAGNOSIS — Z20822 Contact with and (suspected) exposure to covid-19: Secondary | ICD-10-CM | POA: Diagnosis not present

## 2020-08-27 LAB — SARS CORONAVIRUS 2 (TAT 6-24 HRS): SARS Coronavirus 2: NEGATIVE

## 2020-08-27 MED ORDER — PROMETHAZINE-DM 6.25-15 MG/5ML PO SYRP: 5.0000 mL | ORAL_SOLUTION | Freq: Four times a day (QID) | ORAL | 0 refills | Status: AC | PRN

## 2020-08-27 MED ORDER — BENZONATATE 100 MG PO CAPS: 200.0000 mg | ORAL_CAPSULE | Freq: Three times a day (TID) | ORAL | 0 refills | Status: AC

## 2020-08-27 MED ORDER — IPRATROPIUM BROMIDE 0.06 % NA SOLN: 2.0000 | Freq: Four times a day (QID) | NASAL | 12 refills | Status: AC

## 2020-08-27 NOTE — Discharge Instructions (Addendum)

## 2020-08-27 NOTE — ED Provider Notes (Signed)
[facility]    CSN: [id number] Arrival date & time: 08/27/20  1125      History   Chief Complaint Chief Complaint  Patient presents with   Sinus Problem    HPI Paula Jennings is a 55 y.o. female.   HPI  55 year old female here for evaluation of sinus pressure and headache.  Patient reports that she has had nasal congestion, sinus pressure, frontal headache, and nonproductive cough for 1 week.  She states she is also had a single episode of episodic left ear pain.  She denies fever, nasal discharge, sore throat, shortness breath or wheezing, GI complaints, or body aches.  Past Medical History:  Diagnosis Date   Diabetes mellitus without complication (HCC)    Family history of adverse reaction to anesthesia    father-hard time waking up   History of kidney stones    h/o   Neuropathy    Spinal stenosis     Patient Active Problem List   Diagnosis Date Noted   Uncontrolled diabetes mellitus with diabetic neuropathy, with long-term current use of insulin (HCC) 11/25/2018   Tobacco abuse 11/25/2018   Hypertension 11/25/2018    Past Surgical History:  Procedure Laterality Date   CARPAL TUNNEL RELEASE Right 02/11/2020   Procedure: CARPAL TUNNEL RELEASE;  Surgeon: Deeann Saint, MD;  Location: [facility] ORS;  Service: Orthopedics;  Laterality: Right;   EYE SURGERY     KIDNEY STONE SURGERY     TUBAL LIGATION      OB History     Gravida  4   Para      Term      Preterm      AB      Living  4      SAB      IAB      Ectopic      Multiple      Live Births               Home Medications    Prior to Admission medications   Medication Sig Start Date End Date Taking? Authorizing Provider  atorvastatin (LIPITOR) 20 MG tablet Take 1 tablet (20 mg total) by mouth at bedtime. 11/22/18  Yes Danelle Berry, PA-C  benzonatate (TESSALON) 100 MG capsule Take 2 capsules (200 mg total) by mouth every 8 (eight) hours. 08/27/20  Yes Becky Augusta, NP   gabapentin (NEURONTIN) 300 MG capsule Take 300 mg by mouth 4 (four) times daily. 12/30/19  Yes [provider]  insulin detemir (LEVEMIR) 100 UNIT/ML injection Inject 30 Units into the skin every morning.   Yes [provider]  ipratropium (ATROVENT) 0.06 % nasal spray Place 2 sprays into both nostrils 4 (four) times daily. 08/27/20  Yes Becky Augusta, NP  liraglutide (VICTOZA) 18 MG/3ML SOPN Inject 1.2 mg into the skin every morning.   Yes [provider]  lisinopril (ZESTRIL) 5 MG tablet Take 1 tablet (5 mg total) by mouth daily. Patient taking differently: Take 5 mg by mouth every morning. TAKES FOR KIDNEY PROTECTION FOR DM 11/22/18  Yes Danelle Berry, PA-C  meloxicam (MOBIC) 15 MG tablet Take 1 tablet (15 mg total) by mouth daily. 02/11/20  Yes Deeann Saint, MD  promethazine-dextromethorphan (PROMETHAZINE-DM) 6.25-15 MG/5ML syrup Take 5 mLs by mouth 4 (four) times daily as needed. 08/27/20  Yes Becky Augusta, NP    Family History Family History  Problem Relation Age of Onset   Diabetes Mother    Heart disease Father  Hypertension Father    Hypertension Sister    Diabetes Maternal Grandfather    Heart disease Maternal Grandfather    Diabetes Paternal Grandmother     Social History Social History   Tobacco Use   Smoking status: Every Day    Packs/day: 0.50    Years: 15.00    Pack years: 7.50    Types: Cigarettes   Smokeless tobacco: Never  Vaping Use   Vaping Use: Never used  Substance Use Topics   Alcohol use: No   Drug use: No     Allergies   Patient has no known allergies.   Review of Systems Review of Systems  Constitutional:  Negative for activity change, appetite change and fever.  HENT:  Positive for congestion, rhinorrhea, sinus pressure and sinus pain. Negative for ear pain and sore throat.   Respiratory:  Positive for cough. Negative for shortness of breath and wheezing.   Gastrointestinal:  Negative for diarrhea, nausea and  vomiting.  Musculoskeletal:  Negative for arthralgias and myalgias.  Skin:  Negative for rash.  Neurological:  Positive for headaches.  Hematological: Negative.   Psychiatric/Behavioral: Negative.      Physical Exam Triage Vital Signs ED Triage Vitals  Enc Vitals Group     BP 08/27/20 1149 124/65     Pulse Rate 08/27/20 1149 88     Resp 08/27/20 1149 14     Temp 08/27/20 1149 98.4 F (36.9 C)     Temp Source 08/27/20 1149 Oral     SpO2 08/27/20 1149 97 %     Weight 08/27/20 1145 149 lb (67.6 kg)     Height 08/27/20 1145 5\' 5"  (1.651 m)     Head Circumference --      Peak Flow --      Pain Score 08/27/20 1145 3     Pain Loc --      Pain Edu? --      Excl. in [place]? --    No data found.  Updated Vital Signs BP 124/65 (BP Location: Right Arm)   Pulse 88   Temp 98.4 F (36.9 C) (Oral)   Resp 14   Ht 5\' 5"  (1.651 m)   Wt 149 lb (67.6 kg)   LMP 08/24/2020 (Approximate)   SpO2 97%   BMI 24.79 kg/m   Visual Acuity Right Eye Distance:   Left Eye Distance:   Bilateral Distance:    Right Eye Near:   Left Eye Near:    Bilateral Near:     Physical Exam Vitals and nursing note reviewed.  Constitutional:      General: She is not in acute distress.    Appearance: Normal appearance. She is normal weight. She is not ill-appearing.  HENT:     Head: Normocephalic and atraumatic.     Right Ear: Tympanic membrane, ear canal and external ear normal. There is no impacted cerumen.     Left Ear: Tympanic membrane, ear canal and external ear normal. There is no impacted cerumen.     Nose: Congestion and rhinorrhea present.     Mouth/Throat:     Mouth: Mucous membranes are moist.     Pharynx: Oropharynx is clear. No posterior oropharyngeal erythema.  Cardiovascular:     Rate and Rhythm: Normal rate and regular rhythm.     Pulses: Normal pulses.     Heart sounds: Normal heart sounds. No murmur heard.   No gallop.  Pulmonary:     Effort: Pulmonary effort is normal.  Breath  sounds: Normal breath sounds. No wheezing, rhonchi or rales.  Musculoskeletal:     Cervical back: Normal range of motion and neck supple.  Lymphadenopathy:     Cervical: No cervical adenopathy.  Skin:    General: Skin is warm and dry.     Capillary Refill: Capillary refill takes less than 2 seconds.     Findings: No erythema or rash.  Neurological:     General: No focal deficit present.     Mental Status: She is alert and oriented to person, place, and time.  Psychiatric:        Mood and Affect: Mood normal.        Behavior: Behavior normal.        Thought Content: Thought content normal.        Judgment: Judgment normal.     UC Treatments / Results  Labs (all labs ordered are listed, but only abnormal results are displayed) Labs Reviewed  SARS CORONAVIRUS 2 (TAT 6-24 HRS)    EKG   Radiology No results found.  Procedures Procedures (including critical care time)  Medications Ordered in UC Medications - No data to display  Initial Impression / Assessment and Plan / UC Course  I have reviewed the triage vital signs and the nursing notes.  Pertinent labs & imaging results that were available during my care of the patient were reviewed by me and considered in my medical decision making (see chart for details).  Patient is a very pleasant and nontoxic-appearing 55 year old female here for evaluation of respiratory complaints as outlined HPI above.  Patient's physical exam reveals pearly gray tympanic membranes bilaterally with a normal light reflex and clear external auditory canals.  Nasal mucosa is erythematous and edematous with clear nasal discharge.  Oropharyngeal exam is benign.  No cervical lymphadenopathy appreciated on exam.  Cardiopulmonary exam reveals clear lung sounds in all fields.  Will swab patient for COVID and discharged home to isolate pending the results.  We will give Atrovent nasal spray, Tessalon Perles, Promethazine DM cough syrup for symptom relief.   Work note provided.   Final Clinical Impressions(s) / UC Diagnoses   Final diagnoses:  Viral URI with cough     Discharge Instructions      Use the Atrovent nasal spray, 2 squirts in each nostril every 6 hours, as needed for runny nose and postnasal drip.  Use the Tessalon Perles every 8 hours during the day.  Take them with a small sip of water.  They may give you some numbness to the base of your tongue or a metallic taste in your mouth, this is normal.  Use the Promethazine DM cough syrup at bedtime for cough and congestion.  It will make you drowsy so do not take it during the day.  Return for reevaluation or see your primary care provider for any new or worsening symptoms.      ED Prescriptions     Medication Sig Dispense Auth. Provider   benzonatate (TESSALON) 100 MG capsule Take 2 capsules (200 mg total) by mouth every 8 (eight) hours. 21 capsule Becky Augusta, NP   ipratropium (ATROVENT) 0.06 % nasal spray Place 2 sprays into both nostrils 4 (four) times daily. 15 mL Becky Augusta, NP   promethazine-dextromethorphan (PROMETHAZINE-DM) 6.25-15 MG/5ML syrup Take 5 mLs by mouth 4 (four) times daily as needed. 118 mL Becky Augusta, NP      PDMP not reviewed this encounter.   Becky Augusta, NP 08/27/20 [phone]

## 2020-08-27 NOTE — ED Triage Notes (Signed)
Patient c/o sinus congestion and pressure and HA that started a week ago. Patient denies fevers.  Patient took home covid test last night and was negative.

## 2020-11-13 ENCOUNTER — Encounter: Payer: Self-pay | Admitting: Intensive Care

## 2020-11-13 ENCOUNTER — Emergency Department
Admission: EM | Admit: 2020-11-13 | Discharge: 2020-11-13 | Disposition: A | Discharge status: Home / Self Care | Payer: Managed Care, Other (non HMO) | Attending: Emergency Medicine | Admitting: Emergency Medicine

## 2020-11-13 ENCOUNTER — Other Ambulatory Visit: Payer: Self-pay

## 2020-11-13 ENCOUNTER — Emergency Department: Payer: Managed Care, Other (non HMO)

## 2020-11-13 DIAGNOSIS — R110 Nausea: Secondary | ICD-10-CM | POA: Insufficient documentation

## 2020-11-13 DIAGNOSIS — Z5321 Procedure and treatment not carried out due to patient leaving prior to being seen by health care provider: Secondary | ICD-10-CM | POA: Diagnosis not present

## 2020-11-13 DIAGNOSIS — R109 Unspecified abdominal pain: Secondary | ICD-10-CM | POA: Diagnosis not present

## 2020-11-13 LAB — CBC WITH DIFFERENTIAL/PLATELET
Abs Immature Granulocytes: 0.04 10*3/uL (ref 0.00–0.07)
Basophils Absolute: 0.1 10*3/uL (ref 0.0–0.1)
Basophils Relative: 1 %
Eosinophils Absolute: 0.4 10*3/uL (ref 0.0–0.5)
Eosinophils Relative: 4 %
HCT: 34.1 % — ABNORMAL LOW (ref 36.0–46.0)
Hemoglobin: 11.8 g/dL — ABNORMAL LOW (ref 12.0–15.0)
Immature Granulocytes: 0 %
Lymphocytes Relative: 30 %
Lymphs Abs: 3.3 10*3/uL (ref 0.7–4.0)
MCH: 32.5 pg (ref 26.0–34.0)
MCHC: 34.6 g/dL (ref 30.0–36.0)
MCV: 93.9 fL (ref 80.0–100.0)
Monocytes Absolute: 0.7 10*3/uL (ref 0.1–1.0)
Monocytes Relative: 6 %
Neutro Abs: 6.7 10*3/uL (ref 1.7–7.7)
Neutrophils Relative %: 59 %
Platelets: 323 10*3/uL (ref 150–400)
RBC: 3.63 MIL/uL — ABNORMAL LOW (ref 3.87–5.11)
RDW: 12.3 % (ref 11.5–15.5)
WBC: 11.2 10*3/uL — ABNORMAL HIGH (ref 4.0–10.5)
nRBC: 0 % (ref 0.0–0.2)

## 2020-11-13 LAB — COMPREHENSIVE METABOLIC PANEL
ALT: 19 U/L (ref 0–44)
AST: 17 U/L (ref 15–41)
Albumin: 4 g/dL (ref 3.5–5.0)
Alkaline Phosphatase: 48 U/L (ref 38–126)
Anion gap: 6 (ref 5–15)
BUN: 19 mg/dL (ref 6–20)
CO2: 25 mmol/L (ref 22–32)
Calcium: 9.1 mg/dL (ref 8.9–10.3)
Chloride: 104 mmol/L (ref 98–111)
Creatinine, Ser: 0.69 mg/dL (ref 0.44–1.00)
GFR, Estimated: >60 mL/min (ref >60)
Glucose, Bld: 100 mg/dL — ABNORMAL HIGH (ref 70–99)
Potassium: 4.1 mmol/L (ref 3.5–5.1)
Sodium: 135 mmol/L (ref 135–145)
Total Bilirubin: 0.7 mg/dL (ref 0.3–1.2)
Total Protein: 7.4 g/dL (ref 6.5–8.1)

## 2020-11-13 LAB — URINALYSIS, COMPLETE (UACMP) WITH MICROSCOPIC
Bacteria, UA: NONE SEEN
Bilirubin Urine: NEGATIVE
Glucose, UA: NEGATIVE mg/dL
Ketones, ur: NEGATIVE mg/dL
Leukocytes,Ua: NEGATIVE
Nitrite: NEGATIVE
Protein, ur: NEGATIVE mg/dL
Specific Gravity, Urine: 1.018 (ref 1.005–1.030)
pH: 5 (ref 5.0–8.0)

## 2020-11-13 NOTE — ED Triage Notes (Signed)
Patient c/o "kidney pain" that is worse on the right. Reports history of kidney stones and feels the same. Reports nausea. Denies emesis.

## 2021-02-03 ENCOUNTER — Other Ambulatory Visit: Payer: Self-pay | Admitting: Family Medicine

## 2021-02-03 DIAGNOSIS — N6320 Unspecified lump in the left breast, unspecified quadrant: Secondary | ICD-10-CM

## 2021-02-10 ENCOUNTER — Ambulatory Visit
Admission: RE | Admit: 2021-02-10 | Discharge: 2021-02-10 | Disposition: A | Discharge status: Home / Self Care | Payer: Managed Care, Other (non HMO) | Source: Ambulatory Visit | Attending: Family Medicine | Admitting: Family Medicine

## 2021-02-10 ENCOUNTER — Other Ambulatory Visit: Payer: Self-pay

## 2021-02-10 DIAGNOSIS — N6320 Unspecified lump in the left breast, unspecified quadrant: Secondary | ICD-10-CM | POA: Diagnosis not present

## 2021-02-11 ENCOUNTER — Other Ambulatory Visit: Payer: Self-pay | Admitting: Family Medicine

## 2021-02-11 DIAGNOSIS — Z1231 Encounter for screening mammogram for malignant neoplasm of breast: Secondary | ICD-10-CM

## 2021-02-11 DIAGNOSIS — N63 Unspecified lump in unspecified breast: Secondary | ICD-10-CM

## 2021-08-09 ENCOUNTER — Ambulatory Visit
Admission: RE | Admit: 2021-08-09 | Discharge: 2021-08-09 | Disposition: A | Discharge status: Home / Self Care | Payer: Managed Care, Other (non HMO) | Source: Ambulatory Visit | Attending: Family Medicine | Admitting: Family Medicine

## 2021-08-09 DIAGNOSIS — Z1231 Encounter for screening mammogram for malignant neoplasm of breast: Secondary | ICD-10-CM | POA: Insufficient documentation

## 2021-08-09 DIAGNOSIS — N63 Unspecified lump in unspecified breast: Secondary | ICD-10-CM

## 2022-04-28 ENCOUNTER — Emergency Department
Admission: EM | Admit: 2022-04-28 | Discharge: 2022-04-28 | Disposition: A | Discharge status: Home / Self Care | Payer: 59 | Attending: Emergency Medicine | Admitting: Emergency Medicine

## 2022-04-28 ENCOUNTER — Other Ambulatory Visit: Payer: Self-pay

## 2022-04-28 ENCOUNTER — Emergency Department: Payer: 59

## 2022-04-28 DIAGNOSIS — T148XXA Other injury of unspecified body region, initial encounter: Secondary | ICD-10-CM

## 2022-04-28 DIAGNOSIS — Z711 Person with feared health complaint in whom no diagnosis is made: Secondary | ICD-10-CM | POA: Diagnosis present

## 2022-04-28 DIAGNOSIS — E119 Type 2 diabetes mellitus without complications: Secondary | ICD-10-CM | POA: Diagnosis not present

## 2022-04-28 NOTE — ED Triage Notes (Signed)
Pt to ED via POV from home. Pt reports was administering insulin injection to RLQ and believes the needle tip broke off. Pt A&Ox4. Pt in NAD. Pt ambulatory to triage. /

## 2022-04-28 NOTE — Discharge Instructions (Signed)
There is no radiologic evidence of a skin foreign body noted.  Be careful as you inject your subcu medications going forward.  Follow-up with primary provider or return to the ED if needed.

## 2022-04-28 NOTE — ED Notes (Signed)
Patient declined discharge vital signs. 

## 2022-04-28 NOTE — ED Provider Notes (Signed)
[facility] Emergency Department Provider Note     Event Date/Time   First MD Initiated Contact with Patient 04/28/22 1429     (approximate)   History   Foreign Body   HPI  Paula Jennings is a 57 y.o. female history of diabetes with insulin, kidney stones, neuropathy, presents to the ED for concern of a possible retained foreign body.  Patient believes the needle from her Levemir pen broke off in her skin during her medication instillation.  She presents to the ED denies any significant pain or discomfort to the right lower quadrant of her abdomen.  Physical Exam   Triage Vital Signs: ED Triage Vitals  Enc Vitals Group     BP 04/28/22 1343 124/62     Pulse Rate 04/28/22 1343 92     Resp 04/28/22 1343 18     Temp 04/28/22 1343 98.8 F (37.1 C)     Temp src --      SpO2 04/28/22 1343 97 %     Weight --      Height --      Head Circumference --      Peak Flow --      Pain Score 04/28/22 1331 0     Pain Loc --      Pain Edu? --      Excl. in [place]? --     Most recent vital signs: Vitals:   04/28/22 1343  BP: 124/62  Pulse: 92  Resp: 18  Temp: 98.8 F (37.1 C)  SpO2: 97%    General Awake, no distress. NAD CV:  Good peripheral perfusion.  RESP:  Normal effort.  ABD:  No distention.  Right lower quadrant without any signs of active bleeding or puncture wound.  No tenderness to palpation to the superficial skin in the area of concern.  ED Results / Procedures / Treatments   Labs (all labs ordered are listed, but only abnormal results are displayed) Labs Reviewed - No data to display   EKG   RADIOLOGY  I personally viewed and evaluated these images as part of my medical decision making, as well as reviewing the written report by the radiologist.  ED Provider Interpretation: no radiopaque foreign body  DG Abd 2 Views  Result Date: 04/28/2022 CLINICAL DATA:  Insulin injection to right lower quadrant, suspect broken insulin needle  EXAM: ABDOMEN - 2 VIEW COMPARISON:  None Available. FINDINGS: Nonobstructive pattern of bowel gas with gas present to the rectum. No large burden. There is no evidence of free air. No radio-opaque calculi or other significant radiographic abnormality is seen. Phleboliths in the low pelvis. No radiopaque foreign body in the right lower quadrant. IMPRESSION: 1. No radiopaque foreign body in the right lower quadrant. 2. Nonobstructive pattern of bowel gas. Electronically Signed   By: Delanna Ahmadi M.D.   On: 04/28/2022 15:16     PROCEDURES:  Critical Care performed: No  Procedures   MEDICATIONS ORDERED IN ED: Medications - No data to display   IMPRESSION / MDM / [place] / ED COURSE  I reviewed the triage vital signs and the nursing notes.                              Differential diagnosis includes, but is not limited to, retained foreign body, abscess, cellulitis,  Patient's presentation is most consistent with acute complicated illness / injury requiring diagnostic workup.  Patient's diagnosis is consistent with concern for retained skin foreign body without radiologic evidence of the same.  X-ray images viewed by me, does not note any acute radiopaque foreign body in the area of concern.  Patient will be discharged home with wound precautions. Patient is to follow up with her primary provider as needed or otherwise directed. Patient is given ED precautions to return to the ED for any worsening or new symptoms.  FINAL CLINICAL IMPRESSION(S) / ED DIAGNOSES   Final diagnoses:  No problem, feared complaint unfounded  Foreign body in skin    Rx / DC Orders   ED Discharge Orders     None        Note:  This document was prepared using Dragon voice recognition software and may include unintentional dictation errors.    Melvenia Needles, PA-C 04/28/22 1934    Rada Hay, MD 04/28/22 [phone]

## 2022-07-27 ENCOUNTER — Other Ambulatory Visit: Payer: Self-pay | Admitting: Family Medicine

## 2022-07-31 ENCOUNTER — Other Ambulatory Visit: Payer: Self-pay | Admitting: Family Medicine

## 2022-07-31 DIAGNOSIS — N63 Unspecified lump in unspecified breast: Secondary | ICD-10-CM

## 2022-08-18 ENCOUNTER — Ambulatory Visit
Admission: RE | Admit: 2022-08-18 | Discharge: 2022-08-18 | Disposition: A | Discharge status: Home / Self Care | Payer: Managed Care, Other (non HMO) | Source: Ambulatory Visit | Attending: Family Medicine | Admitting: Family Medicine

## 2022-08-18 DIAGNOSIS — N63 Unspecified lump in unspecified breast: Secondary | ICD-10-CM | POA: Diagnosis present

## 2022-09-26 ENCOUNTER — Ambulatory Visit (INDEPENDENT_AMBULATORY_CARE_PROVIDER_SITE_OTHER): Payer: Managed Care, Other (non HMO)

## 2022-09-26 ENCOUNTER — Ambulatory Visit (INDEPENDENT_AMBULATORY_CARE_PROVIDER_SITE_OTHER): Payer: Managed Care, Other (non HMO) | Admitting: Sports Medicine

## 2022-09-26 ENCOUNTER — Encounter: Payer: Self-pay | Admitting: Sports Medicine

## 2022-09-26 DIAGNOSIS — M79641 Pain in right hand: Secondary | ICD-10-CM

## 2022-09-26 DIAGNOSIS — M778 Other enthesopathies, not elsewhere classified: Secondary | ICD-10-CM | POA: Diagnosis not present

## 2022-09-26 MED ORDER — PREDNISONE 10 MG PO TABS: 10.0000 mg | ORAL_TABLET | Freq: Two times a day (BID) | ORAL | 0 refills | Status: AC

## 2022-09-26 NOTE — Progress Notes (Signed)
Paula Jennings - 57 y.o. female MRN [id number]  Date of birth: 1965/12/10  Office Visit Note: Visit Date: 09/26/2022 PCP: Oswaldo Conroy, MD Referred by: Hillery Aldo, MD  Subjective: Chief Complaint  Patient presents with   Right Hand - Pain   HPI: Paula Jennings is a pleasant 57 y.o. female who presents today for evaluation of right hand/ring finger pain.  She has had pain and some restriction in motion for the last 3-4 months.  This has waxed and waned.  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  She does have a history of carpal tunnel release 2+ years ago, but states this feels different as that was more numbness and tingling.  She is not experiencing any numbness tingling or weakness today more so pain.  She is having difficulty extending the fourth ring finger.  She denies any fever or chills, no redness.  She is an insulin-controlled diabetic.  Just saw her primary care physician last week and had an A1c which was well-controlled at 7.2.  She is on Victoza as well as Levemir twice daily, 65 units total.  He does do correctional dosing in the p.m.  Pertinent ROS were reviewed with the patient and found to be negative unless otherwise specified above in HPI.   Assessment & Plan: Visit Diagnoses:  1. Tendinitis of finger of right hand   2. Pain in right hand    Plan: Discussed with Paula Jennings the nature of her chronic right hand and finger pain.  I do think this is more of an overuse injury with some tendinitis specifically of the right ring finger.  There is no redness, effusion and she is not having any fever or chills so low suspicion for tenosynovitis of the flexor tendon but return precautions provided.  She is a diabetic but has worked really hard on controlling her sugars, her last A1c was 7.2.  We will start her on a low-dose of prednisone 10 mg twice daily for 7 days and then may discontinue.  She  will ice and can wear her compression gloves.  Discussed the importance of activity modification and reduction as she is able.  I would like to see her back in about 2 weeks to see the response she receives.  Follow-up: Return in about 2 weeks (around 10/10/2022) for R-hand/finger.   Meds & Orders:  Meds ordered this encounter  Medications   predniSONE (DELTASONE) 10 MG tablet    Sig: Take 1 tablet (10 mg total) by mouth 2 (two) times daily with a meal for 7 days.    Dispense:  14 tablet    Refill:  0    Orders Placed This Encounter  Procedures   XR Hand Complete Right     Procedures: No procedures performed      Clinical History: No specialty comments available.  She reports that she has been smoking cigarettes. She has a 7.5 pack-year smoking history. She has never used smokeless tobacco. No results for input(s): "HGBA1C", "LABURIC" in the last 8760 hours.  Objective:   Vital Signs: There were no vitals taken for this visit.  Physical Exam  Gen: Well-appearing, in no acute distress; non-toxic CV: Well-perfused. Warm.  Resp: Breathing unlabored on room air; no wheezing. Psych: Fluid speech in conversation; appropriate affect; normal thought process Neuro: Sensation intact throughout. No gross coordination deficits.   Ortho Exam - Right hand/wrist: There  is a well-healed incision from prior carpal tunnel surgery which extends proximal to the wrist joint and rather distal to almost the mid aspect of the palm.  There is no palpable Dupuytren's contracture.  There is some tenderness of the flexor tendon of the right ring finger without redness or warmth.  There are some mild TTP over the A1 nodule without thickening or reproducible triggering on exam.  No dactylitis of the hand.  Neurovascular intact.  Cap refill less than 2 seconds.  There is an extension block of about 10 degrees at the PIP 2/2 to pain.  Imaging: XR Hand Complete Right  Result Date: 09/26/2022 3 views of the  right hand including AP, oblique and lateral film were ordered and reviewed by myself.  X-rays show mild degenerative change about the hands and fingers without significant arthropathy.  Neutral ulnar variance.  No acute fracture or otherwise bony abnormality noted.   Past Medical/Family/Surgical/Social History: Medications & Allergies reviewed per EMR, new medications updated. Patient Active Problem List   Diagnosis Date Noted   Uncontrolled diabetes mellitus with diabetic neuropathy, with long-term current use of insulin 11/25/2018   Tobacco abuse 11/25/2018   Hypertension 11/25/2018   Past Medical History:  Diagnosis Date   Diabetes mellitus without complication (HCC)    Family history of adverse reaction to anesthesia    father-hard time waking up   History of kidney stones    h/o   Neuropathy    Spinal stenosis    Family History  Problem Relation Age of Onset   Diabetes Mother    Heart disease Father    Hypertension Father    Hypertension Sister    Diabetes Maternal Grandfather    Heart disease Maternal Grandfather    Diabetes Paternal Grandmother    Breast cancer Neg Hx    Past Surgical History:  Procedure Laterality Date   CARPAL TUNNEL RELEASE Right 02/11/2020   Procedure: CARPAL TUNNEL RELEASE;  Surgeon: Deeann Saint, MD;  Location: [facility] ORS;  Service: Orthopedics;  Laterality: Right;   EYE SURGERY     KIDNEY STONE SURGERY     TUBAL LIGATION     Social History   Occupational History   Not on file  Tobacco Use   Smoking status: Every Day    Current packs/day: 0.50    Average packs/day: 0.5 packs/day for 15.0 years (7.5 ttl pk-yrs)    Types: Cigarettes   Smokeless tobacco: Never  Vaping Use   Vaping status: Never Used  Substance and Sexual Activity   Alcohol use: No   Drug use: No   Sexual activity: Yes

## 2022-09-26 NOTE — Progress Notes (Signed)
History of carpal tunnel release 2+ years ago on right side  Last few months increased hand pain In a brace full time Works in [employer], very hard to do so Denies numbness/tingling Sharp pain Denies weakness in hand

## 2022-10-05 ENCOUNTER — Other Ambulatory Visit: Payer: Self-pay | Admitting: Oncology

## 2022-10-05 DIAGNOSIS — Z006 Encounter for examination for normal comparison and control in clinical research program: Secondary | ICD-10-CM

## 2022-10-10 ENCOUNTER — Encounter: Payer: Self-pay | Admitting: Sports Medicine

## 2022-10-10 ENCOUNTER — Ambulatory Visit (INDEPENDENT_AMBULATORY_CARE_PROVIDER_SITE_OTHER): Payer: 59 | Admitting: Sports Medicine

## 2022-10-10 DIAGNOSIS — M65341 Trigger finger, right ring finger: Secondary | ICD-10-CM

## 2022-10-10 DIAGNOSIS — M778 Other enthesopathies, not elsewhere classified: Secondary | ICD-10-CM | POA: Diagnosis not present

## 2022-10-10 DIAGNOSIS — M19041 Primary osteoarthritis, right hand: Secondary | ICD-10-CM

## 2022-10-10 MED ORDER — LIDOCAINE HCL 1 % IJ SOLN
1.0000 mL | INTRAMUSCULAR | Status: AC | PRN
  Administered 2022-10-10: 1 mL

## 2022-10-10 MED ORDER — BETAMETHASONE SOD PHOS & ACET 6 (3-3) MG/ML IJ SUSP
0.5000 mg | INTRAMUSCULAR | Status: AC | PRN
  Administered 2022-10-10: .5 mg via INTRA_ARTICULAR

## 2022-10-10 NOTE — Progress Notes (Signed)
Office Visit Note   Patient: Paula Jennings           Date of Birth: February 02, 1965           MRN: [id number] Visit Date: 10/10/2022              Requested by: Oswaldo Conroy, MD [address] [place],  [place] [address] PCP: Oswaldo Conroy, MD  Medical Resident, Sports Medicine Fellow - Attending Physician Addendum:   I have independently interviewed and examined the patient myself. I have discussed the above with the original author and agree with their documentation. My edits for correction/addition/clarification have been made, see any changes above and below.   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  Initially was treated for tendinitis but recently started triggering, failed oral corticosteroids.  Through shared decision making had trigger finger injection today (see procedure below). Band-Aid splint applied for the next 72 hours. She does have a degree of osteoarthritis of her hands given the repetitive nature of her profession. Given her work, modified rest will be difficult, we did place a referral to Occupational Therapy, Earnest Rosier, so he could evaluate and hopefully make a custom splint that she can wear with working to help with her tendon and prevention of triggering.  She will follow-up in 1 month.  Madelyn Brunner, DO Primary Care Sports Medicine Physician  [facility] - Orthopedics   Assessment & Plan: Visit Diagnoses:  1. Trigger finger, right ring finger   2. Tendinitis of finger of right hand   3. Primary osteoarthritis, right hand    Plan:   Given patient's minimal improvement with oral prednisone as well as inability to decrease work activities which do contribute to the problem, patient would benefit from a steroid injection of the affected finger at this time. Patient tolerated steroid injection of the right A1 pulley without any  difficulty.  A double Band-Aid was applied to prevent flexion.  Also provided patient with extra Band-Aids in order to be able to apply this Band-Aid splint herself.  Discussed with patient that a one-time injection should resolve the pain and triggering although given that patient will need to continue to stay active there may be a need for second injection.  Patient to follow-up in 1 month and if at that time patient is still having some triggering or pain will repeat injection at that time.    Follow-Up Instructions: Return in about 4 weeks (around 11/07/2022) for trigger finger f/u.   Orders:  Orders Placed This Encounter  Procedures   Hand/UE Inj: R ring A1   Ambulatory referral to Occupational Therapy   No orders of the defined types were placed in this encounter.     Procedures: Hand/UE Inj: R ring A1 for trigger finger on 10/10/2022 11:19 AM Indications: tendon swelling and pain Medications: 1 mL lidocaine 1 %; 0.5 mg betamethasone acetate-betamethasone sodium phosphate 6 (3-3) MG/ML Consent was given by the patient.     Trigger finger injection, Right Ring finger After discussion on risks/benefits/indications and informed verbal consent was obtained, a timeout was performed. The area of tenderness at the A1 pulley was identified with palpable nodule located and marked. This area was cleaned with Betadine swab and alcohol swabs. After sterile precautions were taken, a 25-gauge, 5/8" needle was inserted into the A1 pulley with 0.5cc:1.0cc mixture of Lidocaine  1% and Betamethasone 6mg /mL. Patient tolerated procedure well and was observed for at least 5 minutes after injection with resolution of pain and improved ROM.  Clinical Data: No additional findings.   Subjective: Chief Complaint  Patient presents with   Right Hand - Follow-up, Pain    Patient is presenting today after a 2-week follow-up.  Patient previously seen for right ring finger pain.  Patient states over the past  2 weeks she has been taking the prednisone as prescribed.  Patient states that she felt like pain in her tendon of her right ring finger had improved a little bit but once she stopped doing the prednisone she noted that the pain came back.  Patient also notes that since Friday of last week she has been having more triggering and locking than usual.  Patient is able to open the finger on her own without having to use the other hand.  Patient states that the triggering is not reproducible all the time but it she definitely notices it when she is doing her hand exercises for a long period or if she is working for long periods.  Patient does go back to work tomorrow. Patient sugars were appropriate while taking prednisone and patient did not Naveed to make any insulin adjustments.    Review of Systems   Objective: Vital Signs: There were no vitals taken for this visit.  Physical Exam  Ortho Exam  Right hand: Well-healed incision from prior carpal tunnel surgery.  No palpable Dupuytren's contracture.  Tenderness over the flexor tendon of the right ring finger.  TTP over the A1 nodule.  No thickening.  Patient is unable to reproduce triggering with flexion or extension.  Patient neurovascularly intact.  Sensation appropriate  Specialty Comments:  No specialty comments available.  Imaging: No results found.   PMFS History: Patient Active Problem List   Diagnosis Date Noted   Uncontrolled diabetes mellitus with diabetic neuropathy, with long-term current use of insulin 11/25/2018   Tobacco abuse 11/25/2018   Hypertension 11/25/2018   Past Medical History:  Diagnosis Date   Diabetes mellitus without complication (HCC)    Family history of adverse reaction to anesthesia    father-hard time waking up   History of kidney stones    h/o   Neuropathy    Spinal stenosis     Family History  Problem Relation Age of Onset   Diabetes Mother    Heart disease Father    Hypertension Father     Hypertension Sister    Diabetes Maternal Grandfather    Heart disease Maternal Grandfather    Diabetes Paternal Grandmother    Breast cancer Neg Hx     Past Surgical History:  Procedure Laterality Date   CARPAL TUNNEL RELEASE Right 02/11/2020   Procedure: CARPAL TUNNEL RELEASE;  Surgeon: Deeann Saint, MD;  Location: [facility] ORS;  Service: Orthopedics;  Laterality: Right;   EYE SURGERY     KIDNEY STONE SURGERY     TUBAL LIGATION     Social History   Occupational History   Not on file  Tobacco Use   Smoking status: Every Day    Current packs/day: 0.50    Average packs/day: 0.5 packs/day for 15.0 years (7.5 ttl pk-yrs)    Types: Cigarettes   Smokeless tobacco: Never  Vaping Use   Vaping status: Never Used  Substance and Sexual Activity   Alcohol use: No   Drug use: No   Sexual activity: Yes

## 2022-10-10 NOTE — Progress Notes (Signed)
Patient states that her hand is feeling a little better. She says that she believes that the compression sleeve, ice, and Prednisone have helped some. Patient states that she has some trigger finger starting in her fourth finger of the right hand.

## 2022-10-16 NOTE — Therapy (Signed)
OUTPATIENT OCCUPATIONAL THERAPY ORTHO EVALUATION  Patient Name: Paula Jennings MRN: [id number] DOB:12/04/1965, 57 y.o., female Today's Date: 10/18/2022  PCP: Paula Conroy, MD REFERRING PROVIDER: Madelyn Brunner, DO       OCCUPATIONAL THERAPY DISCHARGE SUMMARY  Visits from Start of Care: 1  CLINICAL IMPRESSION: Patient is a 57 y.o. female who was seen today for occupational therapy evaluation for painful and stiff right hand with triggering ring finger.  After injection she does not rate a very significant impairment level now, and she states understanding all explanations and home exercise program, so she will discharge today to her self-care, home exercise program, and therapist recommendations.  She is set to see the doctor in a month and so at that time if she has lingering issues she was recommended to either seek a consult for hand surgery or another referral to therapy to work on these issues again.  She is in agreement with this plan.   Paula Jennings, OTR/L, CHT 10/18/22    END OF SESSION:  OT End of Session - 10/18/22 0828     Visit Number 1    Authorization Type [employer]    OT Start Time 0830    OT Stop Time 0920    OT Time Calculation (min) 50 min    Equipment Utilized During Treatment orthotic materials    Activity Tolerance Patient tolerated treatment well;No increased pain;Patient limited by pain;Patient limited by fatigue    Behavior During Therapy [facility] for tasks assessed/performed             Past Medical History:  Diagnosis Date   Diabetes mellitus without complication (HCC)    Family history of adverse reaction to anesthesia    father-hard time waking up   History of kidney stones    h/o   Neuropathy    Spinal stenosis    Past Surgical History:  Procedure Laterality Date   CARPAL TUNNEL RELEASE Right 02/11/2020   Procedure: CARPAL TUNNEL RELEASE;  Surgeon: Paula Saint, MD;  Location: [facility] ORS;  Service: Orthopedics;  Laterality: Right;   EYE  SURGERY     KIDNEY STONE SURGERY     TUBAL LIGATION     Patient Active Problem List   Diagnosis Date Noted   Uncontrolled diabetes mellitus with diabetic neuropathy, with long-term current use of insulin 11/25/2018   Tobacco abuse 11/25/2018   Hypertension 11/25/2018    ONSET DATE: 1-2 months ago  REFERRING DIAG: M65.341 (ICD-10-CM) - Trigger finger, right ring finger   THERAPY DIAG:  Pain in right hand  Muscle weakness (generalized)  Rationale for Evaluation and Treatment: Rehabilitation  SUBJECTIVE:   SUBJECTIVE STATEMENT: She states that she had a triggering thumb years ago, but it resolved after carpal tunnel surgery.  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  This was causing her pain so she went and saw Dr. Shon Jennings who gave her an injection for stenosing tenosynovitis, which helped significantly and now she is here for exercises, recommendations, custom orthotic fabrication.     PERTINENT HISTORY: "Ring finger tendinitis with trigger finger, Referral for custom splint"  PRECAUTIONS: None  RED FLAGS: None   WEIGHT BEARING RESTRICTIONS: No  PAIN:  None at rest recently, since infection and 2 days off work  FALLS: Has patient fallen in last 6 months? Yes. Number of falls 1 time accident at work,  not a fall risk   PLOF: Independent  PATIENT GOALS: To understand her hand problem and get back to full function without pain or triggering  NEXT MD VISIT: approx 1 month     OBJECTIVE: (All objective assessments below are from initial evaluation on: 10/18/22 unless otherwise specified.)   HAND DOMINANCE: Right   ADLs: Overall ADLs: States decreased ability to grab, hold kitchen and cooking utensils at work and also household items at times.   FUNCTIONAL OUTCOME MEASURES: Eval: Patient Specific Functional Scale: 8/10  (use a knife, macrame, hold pots)  (Higher Score  =  Better Ability for the Selected Tasks)      UPPER EXTREMITY ROM     Shoulder to Wrist AROM Right eval Left eval  Wrist flexion 65 61  Wrist extension 69 71  (Blank rows = not tested)   Hand AROM Right eval  Full Fist Ability (or Gap to Distal Palmar Crease) 2.5cm gap from tip of R RF to [facility]  Ring MCP (0-90)  approx 0-75  Ring PIP (0-100) Approx 0-80   Ring DIP (0-70) Noticeably stiff approx 0-15*   (Blank rows = not tested)    HAND FUNCTION: Eval: Observed weakness in affected Rt hand due to inhibiting triggering and pain  COORDINATION: Eval: Observed coordination impairments with affected Rt hand, due to inhibiting pain and triggering  SENSATION: Eval:  Light touch intact today  EDEMA:   Eval: none  COGNITION: Eval: Overall cognitive status: WFL for evaluation today   OBSERVATIONS:   Eval: In the volar palm on the right hand, she has some thick fascia and tissue at the base of the ring finger that may represent a early Dupuytren's contracture or be related to her past carpal tunnel release as it is just distal to her surgical line.  This is likely contributing to the tightness around the pulley of the A1 of the ring finger.  She does present like a classic flexor tenosynovitis.  She is only mildly tender to the volar MCP area now.  Holding back the PIP joint with 1 or 2 bandages does prevent triggering.   TODAY'S TREATMENT:  Post-evaluation treatment:  First off she was given anatomy education for the structures and function of this problem.  This is a repetitive/cumulative stress problem so she was given safety education to avoid repetitive and forceful use of her hand and given several ways to compensate and adapt her cooking tasks like increasing handle grip size, not using her fingers and rather using her palms to hold up pot or pan, wrapping a soft towel around handles, etc.  She was supplied with Band-Aids and  reexplain how to do the "double Band-Aid trick."  In the event that flexible bandages are not enough to prevent triggering or the adhesive is irritating to her skin, OT fabricates a custom orthosis to fit around the PIP joint of the right hand ring finger that is low-profile and limits the flexion of this joint and thereby stops triggering.  It fits well, achieves his purpose, and she states she likes it.  She is told to wear either the bandages or this brace at all times until there is no sign of triggering after 1 to 2 months.  If she should ever have a recurrence of triggering she should immediately go back to bandages or orthosis.  Additionally to help manage stiffness and tightness through her hand she was given the following home exercise program which she did well with and caused no  triggering today:  Exercises - Wrist Prayer Stretch  - 4-6 x daily - 3-5 reps - 15 sec hold - Seated Wrist Flexion Stretch  - 1-2 x daily - 3-5 reps - 15 second  hold - HOOK Stretch  - 4 x daily - 3-5 reps - 15-20 sec hold - PUSH KNUCKLES DOWN  - 4 x daily - 3-5 reps - 15 seconds hold - Tendon Glides  - 4-6 x daily - 3-5 reps - 2-3 seconds hold    PATIENT EDUCATION: Education details: See tx section above for details  Person educated: Patient Education method: Verbal Instruction, Teach back, Handouts  Education comprehension: States and demonstrates understanding   HOME EXERCISE PROGRAM: Access Code: [email or web address] URL: [email or web address] Date: 10/18/2022 Prepared by: Paula Jennings   GOALS: Goals reviewed with patient? Yes   SHORT TERM GOALS: (STG required if POC>30 days) Target Date: 10/18/22  Pt will obtain protective, custom orthotic. Goal status: MET   2.  Pt will demo/state understanding of initial HEP to improve pain levels and prerequisite motion. Goal status: MET   ASSESSMENT:  CLINICAL IMPRESSION: Patient is a 57 y.o. female who was seen today for occupational  therapy evaluation for painful and stiff right hand with triggering ring finger.  After injection she does not rate a very significant impairment level now, and she states understanding all explanations and home exercise program, so she will discharge today to her self-care, home exercise program, and therapist recommendations.  She is set to see the doctor in a month and so at that time if she has lingering issues she was recommended to either seek a consult for hand surgery or another referral to therapy to work on these issues again.  She is in agreement with this plan.   PERFORMANCE DEFICITS: in functional skills including ADLs, IADLs, dexterity, pain, fascial restrictions, flexibility, Fine motor control, endurance, decreased knowledge of precautions, and UE functional use, cognitive skills including problem solving and safety awareness, and psychosocial skills including coping strategies, environmental adaptation, habits, and routines and behaviors.   IMPAIRMENTS: are limiting patient from ADLs, IADLs, work, and leisure.   COMORBIDITIES: may have co-morbidities  that affects occupational performance. Patient will benefit from skilled OT to address above impairments and improve overall function.  MODIFICATION OR ASSISTANCE TO COMPLETE EVALUATION: No modification of tasks or assist necessary to complete an evaluation.  OT OCCUPATIONAL PROFILE AND HISTORY: Problem focused assessment: Including review of records relating to presenting problem.  CLINICAL DECISION MAKING: LOW - limited treatment options, no task modification necessary  REHAB POTENTIAL: Excellent  EVALUATION COMPLEXITY: Low      PLAN:  OT FREQUENCY: one time visit  OT DURATION: 10/18/22, 1-time visit  PLANNED INTERVENTIONS: self care/ADL training, therapeutic exercise, therapeutic activity, neuromuscular re-education, manual therapy, passive range of motion, splinting, ultrasound, fluidotherapy, compression bandaging, moist  heat, cryotherapy, contrast bath, patient/family education, coping strategies training, DME and/or AE instructions, and Dry needling  RECOMMENDED OTHER SERVICES: none now   CONSULTED AND AGREED WITH PLAN OF CARE: Patient  PLAN FOR NEXT SESSION:  D/C now, She is set to see the doctor in a month and so at that time if she has lingering issues she was recommended to either seek a consult for hand surgery or another referral to therapy to work on these issues again.  She is in agreement with this plan.    Paula Jennings, OTR/L, CHT 10/18/2022, 9:38 AM

## 2022-10-18 ENCOUNTER — Ambulatory Visit (INDEPENDENT_AMBULATORY_CARE_PROVIDER_SITE_OTHER): Payer: 59 | Admitting: Rehabilitative and Restorative Service Providers"

## 2022-10-18 ENCOUNTER — Encounter: Payer: Self-pay | Admitting: Rehabilitative and Restorative Service Providers"

## 2022-10-18 ENCOUNTER — Other Ambulatory Visit: Payer: Self-pay

## 2022-10-18 DIAGNOSIS — M25641 Stiffness of right hand, not elsewhere classified: Secondary | ICD-10-CM

## 2022-10-18 DIAGNOSIS — M79641 Pain in right hand: Secondary | ICD-10-CM

## 2022-10-18 DIAGNOSIS — M6281 Muscle weakness (generalized): Secondary | ICD-10-CM | POA: Diagnosis not present

## 2022-11-07 ENCOUNTER — Ambulatory Visit (INDEPENDENT_AMBULATORY_CARE_PROVIDER_SITE_OTHER): Payer: Managed Care, Other (non HMO) | Admitting: Sports Medicine

## 2022-11-07 ENCOUNTER — Encounter: Payer: Self-pay | Admitting: Sports Medicine

## 2022-11-07 DIAGNOSIS — M19041 Primary osteoarthritis, right hand: Secondary | ICD-10-CM

## 2022-11-07 DIAGNOSIS — M65341 Trigger finger, right ring finger: Secondary | ICD-10-CM

## 2022-11-07 NOTE — Progress Notes (Signed)
Paula Jennings - 57 y.o. female MRN [id number]  Date of birth: 30-Dec-1965  Office Visit Note: Visit Date: 11/07/2022 PCP: Oswaldo Conroy, MD Referred by: Oswaldo Conroy, MD  Subjective: Chief Complaint  Patient presents with   Right Hand - Follow-up   HPI: Paula Jennings is a pleasant 57 y.o. female who presents today for follow-up of right ring finger trigger finger.  We did proceed with A1 pulley injection for the trigger finger on 10/10/2022.  She had almost immediate relief and had complete resolution of her pain and triggering.  She had just a few days ago only 1 maybe 2 instances where she had a triggering episode with active motion, but this was after a long prep day in the kitchen.  She currently has no pain and has not had any triggering nor having to open up her fingers with close fist and open grip.  She did see our occupational therapist, Earnest Rosier and she does have a custom splint that she wears sometimes with work but more in the afternoon and at nighttime.  Did complete her 1 week of oral prednisone last month. Does take Advil/Ibuprofen as needed.  Pertinent ROS were reviewed with the patient and found to be negative unless otherwise specified above in HPI.   Assessment & Plan: Visit Diagnoses:  1. Trigger finger, right ring finger   2. Primary osteoarthritis, right hand    Plan: Discussed with Reannon that it is reassuring she had complete resolution of her pain and triggering after the trigger finger injection.  She did have just 1 episode after a long prep today where she had a triggering event, but this is the only one.  We discussed continuing her custom trigger finger splint with activity and through the nighttime.  She will continue her stretching and OT exercises.  She may use ibuprofen, ice as needed for pain and swelling control and for her hand arthritis pain.  I did discuss with her that over the next 1-2 months, if she has even a few episodes of  triggering episodes to return back to the office quickly for an additional trigger finger injection to help add to the cumulative benefit.  Given that she is largely improved, we will hold on this for now.  She is agreeable and understanding.  Follow-up: Return if symptoms worsen or fail to improve, for will call if triggering reoccurs (need injection).   Meds & Orders: No orders of the defined types were placed in this encounter.  No orders of the defined types were placed in this encounter.    Procedures: No procedures performed      Clinical History: No specialty comments available.  She reports that she has been smoking cigarettes. She has a 7.5 pack-year smoking history. She has never used smokeless tobacco. No results for input(s): "HGBA1C", "LABURIC" in the last 8760 hours.  Objective:    Physical Exam  Gen: Well-appearing, in no acute distress; non-toxic CV: Well-perfused. Warm.  Resp: Breathing unlabored on room air; no wheezing. Psych: Fluid speech in conversation; appropriate affect; normal thought process Neuro: Sensation intact throughout. No gross coordination deficits.   Ortho Exam -Right hand/wrist: There is a well-healed incision from prior carpal tunnel surgery which does extend distally into the mid aspect of the palm.  There is some mild thickening of the A1 pulley over the right ring finger without redness or swelling.  There is no reproducible triggering on exam today.  Cap refill less than 2  seconds.  There are some bony bossing of the PIP joints, likely secondary to her osteoarthritis.  Imaging: No results found.  Past Medical/Family/Surgical/Social History: Medications & Allergies reviewed per EMR, new medications updated. Patient Active Problem List   Diagnosis Date Noted   Uncontrolled diabetes mellitus with diabetic neuropathy, with long-term current use of insulin 11/25/2018   Tobacco abuse 11/25/2018   Hypertension 11/25/2018   Past Medical  History:  Diagnosis Date   Diabetes mellitus without complication (HCC)    Family history of adverse reaction to anesthesia    father-hard time waking up   History of kidney stones    h/o   Neuropathy    Spinal stenosis    Family History  Problem Relation Age of Onset   Diabetes Mother    Heart disease Father    Hypertension Father    Hypertension Sister    Diabetes Maternal Grandfather    Heart disease Maternal Grandfather    Diabetes Paternal Grandmother    Breast cancer Neg Hx    Past Surgical History:  Procedure Laterality Date   CARPAL TUNNEL RELEASE Right 02/11/2020   Procedure: CARPAL TUNNEL RELEASE;  Surgeon: Deeann Saint, MD;  Location: [facility] ORS;  Service: Orthopedics;  Laterality: Right;   EYE SURGERY     KIDNEY STONE SURGERY     TUBAL LIGATION     Social History   Occupational History   Not on file  Tobacco Use   Smoking status: Every Day    Current packs/day: 0.50    Average packs/day: 0.5 packs/day for 15.0 years (7.5 ttl pk-yrs)    Types: Cigarettes   Smokeless tobacco: Never  Vaping Use   Vaping status: Never Used  Substance and Sexual Activity   Alcohol use: No   Drug use: No   Sexual activity: Yes

## 2022-11-07 NOTE — Progress Notes (Signed)
Patient is doing well. She says that her finger is not triggering anymore and is not painful or sore. She does her exercises from therapy. Patient is not taking any pain medication - she thinks the injection worked well.

## 2022-11-16 ENCOUNTER — Other Ambulatory Visit: Payer: Managed Care, Other (non HMO) | Attending: Oncology

## 2023-05-08 ENCOUNTER — Emergency Department

## 2023-05-08 ENCOUNTER — Encounter: Payer: Self-pay | Admitting: Emergency Medicine

## 2023-05-08 ENCOUNTER — Other Ambulatory Visit: Payer: Self-pay

## 2023-05-08 ENCOUNTER — Emergency Department
Admission: EM | Admit: 2023-05-08 | Discharge: 2023-05-08 | Disposition: A | Discharge status: Home / Self Care | Attending: Emergency Medicine | Admitting: Emergency Medicine

## 2023-05-08 DIAGNOSIS — W228XXA Striking against or struck by other objects, initial encounter: Secondary | ICD-10-CM | POA: Diagnosis not present

## 2023-05-08 DIAGNOSIS — S6991XA Unspecified injury of right wrist, hand and finger(s), initial encounter: Secondary | ICD-10-CM | POA: Diagnosis present

## 2023-05-08 DIAGNOSIS — S63634A Sprain of interphalangeal joint of right ring finger, initial encounter: Secondary | ICD-10-CM | POA: Diagnosis not present

## 2023-05-08 NOTE — ED Provider Notes (Signed)
 [facility] AT [facility] Provider Note   CSN: [id number] Arrival date & time: 05/08/23  1657     History  Chief Complaint  Patient presents with   Hand Pain    Amar Keenum is a 58 y.o. female.  Presents to the emergency department for evaluation of right ring finger pain.  She hit it on a faucet this morning and has been having some pain and swelling to the PIP joint.  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  She has pain along the right ring PIP joint that is new since this morning when she jammed her finger.  HPI     Home Medications Prior to Admission medications   Medication Sig Start Date End Date Taking? Authorizing Provider  atorvastatin (LIPITOR) 20 MG tablet Take 1 tablet (20 mg total) by mouth at bedtime. 11/22/18   Danelle Berry, PA-C  benzonatate (TESSALON) 100 MG capsule Take 2 capsules (200 mg total) by mouth every 8 (eight) hours. 08/27/20   Becky Augusta, NP  gabapentin (NEURONTIN) 300 MG capsule Take 300 mg by mouth 4 (four) times daily. 12/30/19   [provider]  insulin detemir (LEVEMIR) 100 UNIT/ML injection Inject 30 Units into the skin every morning.    [provider]  ipratropium (ATROVENT) 0.06 % nasal spray Place 2 sprays into both nostrils 4 (four) times daily. 08/27/20   Becky Augusta, NP  liraglutide (VICTOZA) 18 MG/3ML SOPN Inject 1.2 mg into the skin every morning.    [provider]  lisinopril (ZESTRIL) 5 MG tablet Take 1 tablet (5 mg total) by mouth daily. Patient taking differently: Take 5 mg by mouth every morning. TAKES FOR KIDNEY PROTECTION FOR DM 11/22/18   Danelle Berry, PA-C  meloxicam (MOBIC) 15 MG tablet Take 1 tablet (15 mg total) by mouth daily. 02/11/20   Deeann Saint, MD  promethazine-dextromethorphan (PROMETHAZINE-DM) 6.25-15 MG/5ML syrup Take 5 mLs by mouth 4 (four) times daily as  needed. 08/27/20   Becky Augusta, NP      Allergies    Patient has no known allergies.    Review of Systems   Review of Systems  Physical Exam Updated Vital Signs BP (!) 182/76   Pulse 92   Temp 98.3 F (36.8 C) (Oral)   Resp 17   Ht 5\' 5"  (1.651 m)   Wt 72.6 kg   SpO2 99%   BMI 26.63 kg/m  Physical Exam Constitutional:      Appearance: She is well-developed.  HENT:     Head: Normocephalic and atraumatic.  Eyes:     Conjunctiva/sclera: Conjunctivae normal.  Cardiovascular:     Rate and Rhythm: Normal rate.  Pulmonary:     Effort: Pulmonary effort is normal. No respiratory distress.  Musculoskeletal:        General: Normal range of motion.     Cervical back: Normal range of motion.     Comments: Right hand shows near full composite fist.  Slight boutonniere deformities of the ring finger of the right hand.  There is possible Dupuytren's contracture along the base of the fourth digit as well as a previous incision from carpal tunnel surgery that appears well.  Patient has stiffness to the joints of the right ring finger that appear to be chronic.  No acute swelling.  No warmth or redness.  No catching or locking.  She has good flexion  and extension but digit maintains a slight boutonniere deformity that patient states is chronic  Skin:    General: Skin is warm.     Findings: No rash.  Neurological:     Mental Status: She is alert and oriented to person, place, and time.  Psychiatric:        Behavior: Behavior normal.        Thought Content: Thought content normal.     ED Results / Procedures / Treatments   Labs (all labs ordered are listed, but only abnormal results are displayed) Labs Reviewed - No data to display  EKG None  Radiology No results found.  Procedures Procedures    Medications Ordered in ED Medications - No data to display  ED Course/ Medical Decision Making/ A&P  X-rays of the right hand ordered and independently reviewed by me today show  no evidence of acute bony abnormality.  No acute changes                               Medical Decision Making Amount and/or Complexity of Data Reviewed Radiology: ordered.   58 year old with right ring finger sprain from jamming earlier this morning.  No evidence of acute bony abnormality seen on x-rays that are ordered and independently reviewed by me today.  Right middle and ring fingers are buddy taped and she is educated on rest ice compression elevation.  She will follow-up and 1 to 2 weeks with PCP or orthopedics if no improvement Final Clinical Impression(s) / ED Diagnoses Final diagnoses:  Sprain of interphalangeal joint of right ring finger, initial encounter    Rx / DC Orders ED Discharge Orders     None         Ronnette Juniper 05/08/23 1904    Sharman Cheek, MD 05/08/23 [phone]

## 2023-05-08 NOTE — Discharge Instructions (Addendum)
 Please keep right hand digits buddy taped.  Apply ice 20 minutes every hour for the next couple days.  May use Tylenol and/or ibuprofen as needed.  Follow-up with primary care provider in 1 week if no improvement

## 2023-05-08 NOTE — ED Notes (Signed)
 See triage note  Presents with pain to right hand  States she his a faucet this am   Hand pain when closing to make a fist

## 2023-05-08 NOTE — ED Triage Notes (Signed)
 Patient to ED via POV ro right hand injury. Pt reports hitting it on a faucet this AM and has been unable to make a fist since.

## 2023-07-19 ENCOUNTER — Other Ambulatory Visit: Payer: Self-pay | Admitting: Family Medicine

## 2023-07-19 DIAGNOSIS — Z1231 Encounter for screening mammogram for malignant neoplasm of breast: Secondary | ICD-10-CM

## 2023-08-23 ENCOUNTER — Ambulatory Visit
Admission: RE | Admit: 2023-08-23 | Discharge: 2023-08-23 | Disposition: A | Discharge status: Home / Self Care | Source: Ambulatory Visit | Attending: Family Medicine | Admitting: Family Medicine

## 2023-08-23 DIAGNOSIS — Z1231 Encounter for screening mammogram for malignant neoplasm of breast: Secondary | ICD-10-CM | POA: Insufficient documentation

## 2023-11-22 ENCOUNTER — Other Ambulatory Visit: Payer: Self-pay | Admitting: Medical Genetics

## 2023-11-22 DIAGNOSIS — Z006 Encounter for examination for normal comparison and control in clinical research program: Secondary | ICD-10-CM
